# Patient Record
Sex: Female | Born: 1995 | Race: Black or African American | Hispanic: No | Marital: Single | Smoking: Never smoker
Health system: Southern US, Community
[De-identification: ages and names within clinical notes are randomized; demographics above are authoritative.]

## PROBLEM LIST (undated history)

## (undated) DIAGNOSIS — D249 Benign neoplasm of unspecified breast: Secondary | ICD-10-CM

## (undated) DIAGNOSIS — J353 Hypertrophy of tonsils with hypertrophy of adenoids: Secondary | ICD-10-CM

## (undated) DIAGNOSIS — G43909 Migraine, unspecified, not intractable, without status migrainosus: Secondary | ICD-10-CM

## (undated) HISTORY — PX: NO PAST SURGERIES: SHX2092

## (undated) HISTORY — DX: Benign neoplasm of unspecified breast: D24.9

---
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
## 2016-02-16 ENCOUNTER — Ambulatory Visit (INDEPENDENT_AMBULATORY_CARE_PROVIDER_SITE_OTHER): Payer: Managed Care, Other (non HMO) | Admitting: Otolaryngology

## 2016-02-16 DIAGNOSIS — L723 Sebaceous cyst: Secondary | ICD-10-CM | POA: Diagnosis not present

## 2016-04-20 ENCOUNTER — Other Ambulatory Visit: Payer: Self-pay | Admitting: Obstetrics & Gynecology

## 2016-11-10 ENCOUNTER — Other Ambulatory Visit: Payer: Self-pay | Admitting: Obstetrics & Gynecology

## 2017-02-08 ENCOUNTER — Encounter (HOSPITAL_COMMUNITY): Payer: Self-pay

## 2017-02-08 ENCOUNTER — Emergency Department (HOSPITAL_COMMUNITY)
Admission: EM | Admit: 2017-02-08 | Discharge: 2017-02-08 | Disposition: A | Discharge status: Home / Self Care | Payer: Managed Care, Other (non HMO) | Attending: Emergency Medicine | Admitting: Emergency Medicine

## 2017-02-08 ENCOUNTER — Emergency Department (HOSPITAL_COMMUNITY): Payer: Managed Care, Other (non HMO)

## 2017-02-08 DIAGNOSIS — N730 Acute parametritis and pelvic cellulitis: Secondary | ICD-10-CM | POA: Diagnosis not present

## 2017-02-08 DIAGNOSIS — N3001 Acute cystitis with hematuria: Secondary | ICD-10-CM

## 2017-02-08 DIAGNOSIS — R1032 Left lower quadrant pain: Secondary | ICD-10-CM | POA: Diagnosis present

## 2017-02-08 DIAGNOSIS — R109 Unspecified abdominal pain: Secondary | ICD-10-CM

## 2017-02-08 LAB — URINALYSIS, ROUTINE W REFLEX MICROSCOPIC
Bilirubin Urine: NEGATIVE
Glucose, UA: NEGATIVE mg/dL
HGB URINE DIPSTICK: NEGATIVE
Ketones, ur: NEGATIVE mg/dL
NITRITE: NEGATIVE
Protein, ur: NEGATIVE mg/dL
SPECIFIC GRAVITY, URINE: 1.017 (ref 1.005–1.030)
pH: 7 (ref 5.0–8.0)

## 2017-02-08 LAB — CBC WITH DIFFERENTIAL/PLATELET
BASOS ABS: 0 10*3/uL (ref 0.0–0.1)
Basophils Relative: 0 %
Eosinophils Absolute: 0.1 10*3/uL (ref 0.0–0.7)
Eosinophils Relative: 0 %
HEMATOCRIT: 32.7 % — AB (ref 36.0–46.0)
HEMOGLOBIN: 11 g/dL — AB (ref 12.0–15.0)
LYMPHS PCT: 18 %
Lymphs Abs: 2.9 10*3/uL (ref 0.7–4.0)
MCH: 27.2 pg (ref 26.0–34.0)
MCHC: 33.6 g/dL (ref 30.0–36.0)
MCV: 80.9 fL (ref 78.0–100.0)
MONO ABS: 1.2 10*3/uL — AB (ref 0.1–1.0)
Monocytes Relative: 7 %
NEUTROS ABS: 12.2 10*3/uL — AB (ref 1.7–7.7)
NEUTROS PCT: 75 %
Platelets: 367 10*3/uL (ref 150–400)
RBC: 4.04 MIL/uL (ref 3.87–5.11)
RDW: 12.7 % (ref 11.5–15.5)
WBC: 16.4 10*3/uL — AB (ref 4.0–10.5)

## 2017-02-08 LAB — BASIC METABOLIC PANEL
ANION GAP: 10 (ref 5–15)
BUN: 8 mg/dL (ref 6–20)
CHLORIDE: 100 mmol/L — AB (ref 101–111)
CO2: 23 mmol/L (ref 22–32)
Calcium: 9.1 mg/dL (ref 8.9–10.3)
Creatinine, Ser: 0.88 mg/dL (ref 0.44–1.00)
GFR calc non Af Amer: >60 mL/min (ref >60)
Glucose, Bld: 99 mg/dL (ref 65–99)
Potassium: 3.6 mmol/L (ref 3.5–5.1)
Sodium: 133 mmol/L — ABNORMAL LOW (ref 135–145)

## 2017-02-08 LAB — WET PREP, GENITAL
CLUE CELLS WET PREP: NONE SEEN
SPERM: NONE SEEN
TRICH WET PREP: NONE SEEN
YEAST WET PREP: NONE SEEN

## 2017-02-08 LAB — GC/CHLAMYDIA PROBE AMP (~~LOC~~) NOT AT ARMC
CHLAMYDIA, DNA PROBE: NEGATIVE
Neisseria Gonorrhea: NEGATIVE

## 2017-02-08 LAB — POC URINE PREG, ED: PREG TEST UR: NEGATIVE

## 2017-02-08 MED ORDER — ONDANSETRON HCL 4 MG/2ML IJ SOLN
4.0000 mg | Freq: Once | INTRAMUSCULAR | Status: AC
  Administered 2017-02-08: 4 mg via INTRAVENOUS
  Filled 2017-02-08: qty 2

## 2017-02-08 MED ORDER — ONDANSETRON 4 MG PO TBDP: 4.0000 mg | ORAL_TABLET | Freq: Three times a day (TID) | ORAL | 0 refills | Status: DC | PRN

## 2017-02-08 MED ORDER — DOXYCYCLINE HYCLATE 100 MG PO CAPS
100.0000 mg | ORAL_CAPSULE | Freq: Two times a day (BID) | ORAL | 0 refills | Status: DC
Start: 2017-02-08 — End: 2017-04-09

## 2017-02-08 MED ORDER — SODIUM CHLORIDE 0.9 % IV BOLUS (SEPSIS)
1000.0000 mL | Freq: Once | INTRAVENOUS | Status: AC
  Administered 2017-02-08: 1000 mL via INTRAVENOUS

## 2017-02-08 MED ORDER — ACETAMINOPHEN 325 MG PO TABS
ORAL_TABLET | ORAL | Status: AC
  Filled 2017-02-08: qty 2

## 2017-02-08 MED ORDER — DOXYCYCLINE HYCLATE 100 MG PO TABS
100.0000 mg | ORAL_TABLET | Freq: Once | ORAL | Status: AC
  Administered 2017-02-08: 100 mg via ORAL
  Filled 2017-02-08: qty 1

## 2017-02-08 MED ORDER — CEFTRIAXONE SODIUM 250 MG IJ SOLR: 250.0000 mg | Freq: Once | INTRAMUSCULAR | Status: DC

## 2017-02-08 MED ORDER — ACETAMINOPHEN 325 MG PO TABS
650.0000 mg | ORAL_TABLET | Freq: Once | ORAL | Status: AC
  Administered 2017-02-08: 650 mg via ORAL

## 2017-02-08 MED ORDER — DEXTROSE 5 % IV SOLN
1.0000 g | Freq: Once | INTRAVENOUS | Status: AC
  Administered 2017-02-08: 1 g via INTRAVENOUS
  Filled 2017-02-08: qty 10

## 2017-02-08 MED ORDER — CEPHALEXIN 500 MG PO CAPS: 500.0000 mg | ORAL_CAPSULE | Freq: Four times a day (QID) | ORAL | 0 refills | Status: DC

## 2017-02-08 NOTE — ED Provider Notes (Signed)
[place] DEPT Provider Note   CSN: [id number] Arrival date & time: 02/08/17  0126     History   Chief Complaint Chief Complaint  Patient presents with  . Hematuria    HPI Sherri Chang is a 21 y.o. female.  Patient is concerned that she has a kidney stone. She developed urinary urgency frequency and dysuria last night. Throughout the day today she developed left-sided flank pain and lower abdominal pain. Also had a fever at home to 101. She is febrile on arrival to the ED. Endorses dark-colored urine that she thinks is bloody. The pain is intermittent in her back and comes and goes but constant in her lower abdomen associated with burning with urination. Denies any abnormal vaginal discharge has irregular vaginal bleeding at baseline which is unchanged. Denies any chest pain or shortness of breath. Denies any vomiting or diarrhea. Denies any previous history of kidney stones or previous abdominal surgeries. She has not taken any antipyretics.   The history is provided by the patient.  Hematuria  Associated symptoms include abdominal pain. Pertinent negatives include no chest pain, no headaches and no shortness of breath.    History reviewed. No pertinent past medical history.  Patient Active Problem List   Diagnosis Date Noted  . Menorrhagia 06/30/2014  . Dysmenorrhea 06/30/2014    History reviewed. No pertinent surgical history.  OB History    No data available       Home Medications    Prior to Admission medications   Medication Sig Start Date End Date Taking? Authorizing Provider  APRI 0.15-30 MG-MCG tablet TAKE 1 TABLET BY MOUTH DAILY. 04/20/16  Yes Florian Buff, MD  acetaminophen (TYLENOL) 500 MG tablet Take 500 mg by mouth every 6 (six) hours as needed for moderate pain or headache.    Historical Provider, MD  amoxicillin (AMOXIL) 500 MG capsule Take 1 capsule (500 mg total) by mouth 3 (three) times daily. 08/17/15   Lily Kocher, PA-C  ibuprofen  (ADVIL,MOTRIN) 400 MG tablet Take 1 tablet (400 mg total) by mouth every 6 (six) hours as needed. 08/17/15   Lily Kocher, PA-C  naproxen sodium (ANAPROX) 550 MG tablet Take 550 mg by mouth 2 (two) times daily as needed for moderate pain.    Historical Provider, MD  naproxen sodium (ANAPROX) 550 MG tablet TAKE 1 TABLET (550 MG TOTAL) BY MOUTH 2 (TWO) TIMES DAILY WITH A MEAL. 11/11/16   Florian Buff, MD    Family History No family history on file.  Social History Social History  Substance Use Topics  . Smoking status: Never Smoker  . Smokeless tobacco: Never Used  . Alcohol use No     Allergies   Latex   Review of Systems Review of Systems  Constitutional: Positive for fever. Negative for activity change and appetite change.  HENT: Negative for congestion.   Respiratory: Negative for cough, chest tightness and shortness of breath.   Cardiovascular: Negative for chest pain.  Gastrointestinal: Positive for abdominal pain and nausea. Negative for vomiting.  Genitourinary: Positive for difficulty urinating, dysuria, flank pain, frequency and hematuria. Negative for vaginal bleeding and vaginal discharge.  Musculoskeletal: Positive for back pain. Negative for arthralgias and myalgias.  Neurological: Negative for dizziness, weakness and headaches.   A complete 10 system review of systems was obtained and all systems are negative except as noted in the HPI and PMH.    Physical Exam Updated Vital Signs BP 128/76 (BP Location: Left Arm)   Pulse Marland Kitchen)  109   Temp (!) 102.7 F (39.3 C) (Oral)   Resp 16   Ht 5' (1.524 m)   Wt 100 lb (45.4 kg)   LMP 01/29/2017 (Exact Date)   SpO2 100%   BMI 19.53 kg/m   Physical Exam  Constitutional: She is oriented to person, place, and time. She appears well-developed and well-nourished. No distress.  HENT:  Head: Normocephalic and atraumatic.  Mouth/Throat: Oropharynx is clear and moist. No oropharyngeal exudate.  Eyes: Conjunctivae and EOM  are normal. Pupils are equal, round, and reactive to light.  Neck: Normal range of motion. Neck supple.  No meningismus.  Cardiovascular: Normal rate, regular rhythm, normal heart sounds and intact distal pulses.   No murmur heard. Pulmonary/Chest: Effort normal and breath sounds normal. No respiratory distress.  Abdominal: Soft. There is tenderness. There is no rebound and no guarding.  Lower abdominal tenderness. No guarding or rebound  Genitourinary:  Genitourinary Comments: Chaperone present. Normal external genitalia. No vaginal discharge. Friable cervix with slight tenderness. No uterine tenderness. No lateralizing adnexal tenderness.  Musculoskeletal: Normal range of motion. She exhibits tenderness. She exhibits no edema.  L paraspinal lumbar tenderness  Neurological: She is alert and oriented to person, place, and time. No cranial nerve deficit. She exhibits normal muscle tone. Coordination normal.  No ataxia on finger to nose bilaterally. No pronator drift. 5/5 strength throughout. CN 2-12 intact.Equal grip strength. Sensation intact.   Skin: Skin is warm.  Psychiatric: She has a normal mood and affect. Her behavior is normal.  Nursing note and vitals reviewed.    ED Treatments / Results  Labs (all labs ordered are listed, but only abnormal results are displayed) Labs Reviewed  URINALYSIS, ROUTINE W REFLEX MICROSCOPIC - Abnormal; Notable for the following:       Result Value   APPearance HAZY (*)    Leukocytes, UA MODERATE (*)    Bacteria, UA RARE (*)    Squamous Epithelial / LPF 0-5 (*)    All other components within normal limits  URINE CULTURE  CBC WITH DIFFERENTIAL/PLATELET  BASIC METABOLIC PANEL  POC URINE PREG, ED    EKG  EKG Interpretation None       Radiology Ct Renal Stone Study  Result Date: 02/08/2017 CLINICAL DATA:  Left flank pain.  Fever and hematuria. EXAM: CT ABDOMEN AND PELVIS WITHOUT CONTRAST TECHNIQUE: Multidetector CT imaging of the abdomen  and pelvis was performed following the standard protocol without IV contrast. COMPARISON:  None. FINDINGS: Lower chest: The lung bases are clear. No pleural fluid or consolidation. Hepatobiliary: No focal liver abnormality is seen. No gallstones, gallbladder wall thickening, or biliary dilatation. Pancreas: Suboptimally assessed due to lack of contrast and paucity of intra-abdominal fat. No ductal dilatation or inflammation. Spleen: Normal in size without focal abnormality. Adrenals/Urinary Tract: No hydronephrosis or perinephric edema. No urolithiasis. Urinary bladder is nondistended and not well evaluated. Question of bladder wall thickening. No adrenal nodule. Stomach/Bowel: Limited assessment secondary to lack of enteric contrast and paucity of body fat. Particularly, bowel loops in the pelvis are suboptimally evaluated. No evidence of obstruction. Pelvic bowel loops are fluid-filled but nondilated. Appendix is not visualized. Vascular/Lymphatic: Limited assessment secondary to lack of contrast and paucity of body fat. No gross adenopathy. Reproductive: Diffuse induration of pelvic fat with fat stranding. Unenhanced uterus is unremarkable. The adnexa are suboptimally defined given adjacent pelvic bowel loops. Other: No free air.  No ascites. Musculoskeletal: There are no acute or suspicious osseous abnormalities. IMPRESSION: 1. No renal  stones or obstructive uropathy. Possible bladder wall thickening which suggests cystitis. 2. Induration of pelvic fat diffusely, nonspecific but can be seen with pelvic inflammatory disease. The adnexa including ovaries are not well-defined. Electronically Signed   By: Jeb Levering M.D.   On: 02/08/2017 03:19    Procedures Procedures (including critical care time)  Medications Ordered in ED Medications  sodium chloride 0.9 % bolus 1,000 mL (not administered)  ondansetron (ZOFRAN) injection 4 mg (not administered)  acetaminophen (TYLENOL) tablet 650 mg (650 mg Oral  Given 02/08/17 0143)     Initial Impression / Assessment and Plan / ED Course  I have reviewed the triage vital signs and the nursing notes.  Pertinent labs & imaging results that were available during my care of the patient were reviewed by me and considered in my medical decision making (see chart for details).    Urinary symptoms with lower abdominal pain, flank pain and fever. Patient tachycardic and febrile but nontoxic appearing.  HCG is negative. Urinalysis shows leukocyte esterase and white blood cells. Culture sent.  CT scan shows no ureteral stones. Does show inflammation of uterus and adnexa. Pelvic exam performed as above.  Will treat for possible PID as well given mild CMT.  Patient already received 1 g Rocephin IV for UTI.  D/w Tiffany RPH who states this is adequate PID treatment as well and IM rocephin not necessary.  Will treat with doxycycline to complete PID treatment and keflex for UTI.  Patient well-appearing, tolerating by mouth, heart rate and fever improved. We'll treat for PID and UTI. Urine culture pending. Return precautions discussed.  BP 120/76 (BP Location: Left Arm)   Pulse 95   Temp 99.1 F (37.3 C) (Oral)   Resp 18   Ht 5' (1.524 m)   Wt 100 lb (45.4 kg)   LMP 01/29/2017 (Exact Date) Comment: UPREG NEGATIVE  SpO2 100%   BMI 19.53 kg/m    Final Clinical Impressions(s) / ED Diagnoses   Final diagnoses:  Flank pain  Acute cystitis with hematuria  PID (acute pelvic inflammatory disease)    New Prescriptions New Prescriptions   No medications on file     Ezequiel Essex, MD 02/08/17 0522

## 2017-02-08 NOTE — ED Triage Notes (Signed)
I think I have a kidney stone.  I have a fever, blood in my urine, and I am constantly going to the bathroom.  Started going to the bathroom a lot last night.  Started running a fever today and having blood in my urine today.  I am also having pain in my back on the left side and in my abdomen on the left side.  Burning with urination also.

## 2017-02-08 NOTE — Discharge Instructions (Signed)
Take the antibiotics as prescribed. Establish care with a primary care physician. Return to the ED if you develop new or worsening symptoms.

## 2017-02-09 LAB — URINE CULTURE: Culture: NO GROWTH

## 2017-02-28 ENCOUNTER — Ambulatory Visit (INDEPENDENT_AMBULATORY_CARE_PROVIDER_SITE_OTHER): Payer: Managed Care, Other (non HMO) | Admitting: Otolaryngology

## 2017-02-28 ENCOUNTER — Other Ambulatory Visit: Payer: Self-pay | Admitting: Obstetrics & Gynecology

## 2017-02-28 DIAGNOSIS — J351 Hypertrophy of tonsils: Secondary | ICD-10-CM | POA: Diagnosis not present

## 2017-02-28 DIAGNOSIS — J3501 Chronic tonsillitis: Secondary | ICD-10-CM | POA: Diagnosis not present

## 2017-03-12 DIAGNOSIS — J353 Hypertrophy of tonsils with hypertrophy of adenoids: Secondary | ICD-10-CM

## 2017-03-12 HISTORY — DX: Hypertrophy of tonsils with hypertrophy of adenoids: J35.3

## 2017-03-19 ENCOUNTER — Other Ambulatory Visit: Payer: Self-pay | Admitting: Otolaryngology

## 2017-04-09 ENCOUNTER — Encounter (HOSPITAL_BASED_OUTPATIENT_CLINIC_OR_DEPARTMENT_OTHER): Payer: Self-pay | Admitting: *Deleted

## 2017-04-15 ENCOUNTER — Ambulatory Visit (HOSPITAL_BASED_OUTPATIENT_CLINIC_OR_DEPARTMENT_OTHER)
Admission: RE | Admit: 2017-04-15 | Payer: Managed Care, Other (non HMO) | Source: Ambulatory Visit | Admitting: Otolaryngology

## 2017-04-15 HISTORY — DX: Hypertrophy of tonsils with hypertrophy of adenoids: J35.3

## 2017-04-15 HISTORY — DX: Migraine, unspecified, not intractable, without status migrainosus: G43.909

## 2017-04-15 SURGERY — TONSILLECTOMY AND ADENOIDECTOMY
Anesthesia: General

## 2017-11-01 ENCOUNTER — Ambulatory Visit: Payer: 59 | Admitting: Adult Health

## 2017-11-01 ENCOUNTER — Encounter: Payer: Self-pay | Admitting: Adult Health

## 2017-11-01 VITALS — BP 112/80 | HR 74 | Ht 60.0 in | Wt 97.5 lb

## 2017-11-01 DIAGNOSIS — N6313 Unspecified lump in the right breast, lower outer quadrant: Secondary | ICD-10-CM

## 2017-11-01 NOTE — Progress Notes (Signed)
Subjective:     Patient ID: Sherri Chang, female   DOB: 09-Oct-1996, 21 y.o.   MRN: [id number]  HPI Sherri Chang is a 21 year old black female in complaining or right breast lump, that is tender and she first noticed Sunday.  Review of Systems Right breast lump, tender  Reviewed past medical,surgical, social and family history. Reviewed medications and allergies.     Objective:   Physical Exam BP 112/80 (BP Location: Left Arm, Patient Position: Sitting, Cuff Size: Normal)   Pulse 74   Ht 5' (1.524 m)   Wt 97 lb 8 oz (44.2 kg)   BMI 19.04 kg/m     Skin warm and dry,  Breasts:no dominate palpable mass, retraction or nipple discharge on left, on right, no retraction or nipple discharge, has round, mobile, tender marble sized mass at 7 o'clock, 2 FB from areola. Will get [place] to assess.  Assessment:     1. Mass of lower outer quadrant of right breast       Plan:     Right breast US at [facility] 11/13/17 at 11:40 am F/U prn Review handouts on breat cyst and fibroadenoma

## 2017-11-01 NOTE — Patient Instructions (Signed)
Breast Cyst A breast cyst is a sac in the breast that is filled with fluid. Breast cysts are usually noncancerous (benign). They are common among women, and they are most often located in the upper, outer portion of the breast. One or more cysts may develop. They form when fluid builds up inside of the breast glands. There are several types of breast cysts:  Macrocyst. This is a cyst that is about 2 inches (5.1 cm) across (in diameter).  Microcyst. This is a very small cyst that you cannot feel, but it can be seen with imaging tests such as an X-ray of the breast (mammogram) or ultrasound.  Galactocele. This is a cyst that contains milk. It may develop if you suddenly stop breastfeeding.  Breast cysts do not increase your risk of breast cancer. They usually disappear after menopause, unless you take artificial hormones (are on hormone therapy). What are the causes? The exact cause of breast cysts is not known. Possible causes include:  Blockage of tubes (ducts) in the breast glands, which leads to fluid buildup. Duct blockage may result from: ? Fibrocystic breast changes. This is a common, benign condition that occurs when women go through hormonal changes during the menstrual cycle. This is a common cause of multiple breast cysts. ? Overgrowth of breast tissue or breast glands. ? Scar tissue in the breast from previous surgery.  Changes in certain female hormones (estrogen and progesterone).  What increases the risk? You may be more likely to develop breast cysts if you have not gone through menopause. What are the signs or symptoms? Symptoms of a breast cyst may include:  Feeling one or more smooth, round, soft lumps (like grapes) in the breast that are easily moveable. The lump(s) may get bigger and more painful before your period and get smaller after your period.  Breast discomfort or pain.  How is this diagnosed? A cyst can be felt during a physical exam by your health care  provider. A mammogram and ultrasound will be done to confirm the diagnosis. Fluid may be removed from the cyst with a needle (fine-needle aspiration) and tested to make sure the cyst is not cancerous. How is this treated? Treatment may not be necessary. Your health care provider may monitor the cyst to see if it goes away on its own. If the cyst is uncomfortable or gets bigger, or if you do not like how the cyst makes your breast look, you may need treatment. Treatment may include:  Hormone treatment.  Fine-needle aspiration, to drain fluid from the cyst. There is a chance of the cyst coming back (recurring) after aspiration.  Surgery to remove the cyst.  Follow these instructions at home:  See your health care provider regularly. ? Get a yearly physical exam. ? If you are 20-40 years old, get a clinical breast exam every 1-3 years. After age 40, get this exam every year. ? Get mammograms as often as directed.  Do a breast self-exam every month, or as often as directed. Having many breast cysts, or "lumpy" breasts, may make it harder to feel for new lumps. Understand how your breasts normally look and feel, and write down any changes in your breasts so you can tell your health care provider about the changes. A breast self-exam involves: ? Comparing your breasts in the mirror. ? Looking for visible changes in your skin or nipples. ? Feeling for lumps or changes.  Take over-the-counter and prescription medicines only as told by your health care   provider.  Wear a supportive bra, especially when exercising.  Follow instructions from your health care provider about eating and drinking restrictions. ? Avoid caffeine. ? Cut down on salt (sodium) in what you eat and drink, especially before your menstrual period. Too much sodium can cause fluid buildup (retention), breast swelling, and discomfort.  Keep all follow-up visits as told your health care provider. This is important. Contact a  health care provider if:  You feel, or think you feel, a lump in your breast.  You notice that both breasts look or feel different than usual.  Your breast is still causing pain after your menstrual period is over.  You find new lumps or bumps that were not there before.  You feel lumps in your armpit (axilla). Get help right away if:  You have severe pain, tenderness, redness, or warmth in your breast.  You have fluid or blood leaking from your nipple.  Your breast lump becomes hard and painful.  You notice dimpling or wrinkling of the breast or nipple. This information is not intended to replace advice given to you by your health care provider. Make sure you discuss any questions you have with your health care provider. Document Released: 10/29/2005 Document Revised: 07/20/2016 Document Reviewed: 07/20/2016 Elsevier Interactive Patient Education  2017 [place] A fibroadenoma is a lump (tumor) in the breast. The lump is not cancer (is benign). It may move under your skin when you touch it. This kind of lump can grow in one breast or in both breasts. Follow these instructions at home:  If you had a lump removed, follow instructions from your doctor for home care after the procedure.  Check your breasts at home as told by your doctor.  Keep all follow-up visits as told by your doctor. This is important. Contact a doctor if:  The lump changes in size or feels different.  The lump starts to be painful.  You find a new lump.  You have any changes in the skin that covers your breast.  You have any changes in your nipple.  You have fluid leaking from your nipple. This information is not intended to replace advice given to you by your health care provider. Make sure you discuss any questions you have with your health care provider. Document Released: 01/25/2009 Document Revised: 04/05/2016 Document Reviewed: 10/20/2014 Elsevier Interactive Patient Education   [employer].

## 2017-11-13 ENCOUNTER — Other Ambulatory Visit (HOSPITAL_COMMUNITY): Payer: Managed Care, Other (non HMO)

## 2017-11-13 ENCOUNTER — Ambulatory Visit (HOSPITAL_COMMUNITY): Admission: RE | Admit: 2017-11-13 | Payer: Managed Care, Other (non HMO) | Source: Ambulatory Visit

## 2017-11-19 ENCOUNTER — Ambulatory Visit (HOSPITAL_COMMUNITY): Payer: Managed Care, Other (non HMO)

## 2017-11-19 ENCOUNTER — Encounter: Payer: Self-pay | Admitting: Adult Health

## 2017-11-19 ENCOUNTER — Ambulatory Visit (HOSPITAL_COMMUNITY)
Admission: RE | Admit: 2017-11-19 | Discharge: 2017-11-19 | Disposition: A | Discharge status: Home / Self Care | Payer: Managed Care, Other (non HMO) | Source: Ambulatory Visit | Attending: Adult Health | Admitting: Adult Health

## 2017-11-19 DIAGNOSIS — N6313 Unspecified lump in the right breast, lower outer quadrant: Secondary | ICD-10-CM | POA: Insufficient documentation

## 2017-11-19 DIAGNOSIS — D249 Benign neoplasm of unspecified breast: Secondary | ICD-10-CM

## 2017-11-19 HISTORY — DX: Benign neoplasm of unspecified breast: D24.9

## 2017-12-15 ENCOUNTER — Other Ambulatory Visit: Payer: Self-pay | Admitting: Obstetrics & Gynecology

## 2018-05-22 ENCOUNTER — Emergency Department (HOSPITAL_COMMUNITY)
Admission: EM | Admit: 2018-05-22 | Discharge: 2018-05-22 | Disposition: A | Discharge status: Home / Self Care | Payer: Managed Care, Other (non HMO) | Attending: Emergency Medicine | Admitting: Emergency Medicine

## 2018-05-22 ENCOUNTER — Encounter (HOSPITAL_COMMUNITY): Payer: Self-pay | Admitting: Emergency Medicine

## 2018-05-22 ENCOUNTER — Other Ambulatory Visit: Payer: Self-pay

## 2018-05-22 DIAGNOSIS — R102 Pelvic and perineal pain: Secondary | ICD-10-CM | POA: Diagnosis present

## 2018-05-22 DIAGNOSIS — Z79899 Other long term (current) drug therapy: Secondary | ICD-10-CM | POA: Diagnosis not present

## 2018-05-22 DIAGNOSIS — B9689 Other specified bacterial agents as the cause of diseases classified elsewhere: Secondary | ICD-10-CM

## 2018-05-22 DIAGNOSIS — N760 Acute vaginitis: Secondary | ICD-10-CM | POA: Diagnosis not present

## 2018-05-22 LAB — CBC WITH DIFFERENTIAL/PLATELET
Basophils Absolute: 0 10*3/uL (ref 0.0–0.1)
Basophils Relative: 0 %
Eosinophils Absolute: 0.1 10*3/uL (ref 0.0–0.7)
Eosinophils Relative: 1 %
HCT: 36.2 % (ref 36.0–46.0)
Hemoglobin: 11.9 g/dL — ABNORMAL LOW (ref 12.0–15.0)
Lymphocytes Relative: 54 %
Lymphs Abs: 2.8 10*3/uL (ref 0.7–4.0)
MCH: 27.8 pg (ref 26.0–34.0)
MCHC: 32.9 g/dL (ref 30.0–36.0)
MCV: 84.6 fL (ref 78.0–100.0)
Monocytes Absolute: 0.4 10*3/uL (ref 0.1–1.0)
Monocytes Relative: 8 %
Neutro Abs: 2 10*3/uL (ref 1.7–7.7)
Neutrophils Relative %: 37 %
Platelets: 334 10*3/uL (ref 150–400)
RBC: 4.28 MIL/uL (ref 3.87–5.11)
RDW: 13.1 % (ref 11.5–15.5)
WBC: 5.2 10*3/uL (ref 4.0–10.5)

## 2018-05-22 LAB — COMPREHENSIVE METABOLIC PANEL
ALT: 10 U/L (ref 0–44)
AST: 17 U/L (ref 15–41)
Albumin: 4.1 g/dL (ref 3.5–5.0)
Alkaline Phosphatase: 43 U/L (ref 38–126)
Anion gap: 8 (ref 5–15)
BUN: 7 mg/dL (ref 6–20)
CO2: 24 mmol/L (ref 22–32)
Calcium: 9 mg/dL (ref 8.9–10.3)
Chloride: 107 mmol/L (ref 98–111)
Creatinine, Ser: 0.64 mg/dL (ref 0.44–1.00)
GFR calc Af Amer: >60 mL/min (ref >60)
GFR calc non Af Amer: >60 mL/min (ref >60)
Glucose, Bld: 90 mg/dL (ref 70–99)
Potassium: 3.6 mmol/L (ref 3.5–5.1)
Sodium: 139 mmol/L (ref 135–145)
Total Bilirubin: 0.9 mg/dL (ref 0.3–1.2)
Total Protein: 7.8 g/dL (ref 6.5–8.1)

## 2018-05-22 LAB — URINALYSIS, ROUTINE W REFLEX MICROSCOPIC
Bilirubin Urine: NEGATIVE
Glucose, UA: NEGATIVE mg/dL
Hgb urine dipstick: NEGATIVE
Ketones, ur: NEGATIVE mg/dL
Leukocytes, UA: NEGATIVE
Nitrite: NEGATIVE
Protein, ur: NEGATIVE mg/dL
Specific Gravity, Urine: 1.021 (ref 1.005–1.030)
pH: 6 (ref 5.0–8.0)

## 2018-05-22 LAB — PREGNANCY, URINE: Preg Test, Ur: NEGATIVE

## 2018-05-22 LAB — WET PREP, GENITAL
Sperm: NONE SEEN
Trich, Wet Prep: NONE SEEN
Yeast Wet Prep HPF POC: NONE SEEN

## 2018-05-22 LAB — LIPASE, BLOOD: Lipase: 36 U/L (ref 11–51)

## 2018-05-22 MED ORDER — METRONIDAZOLE 500 MG PO TABS: 500.0000 mg | ORAL_TABLET | Freq: Two times a day (BID) | ORAL | 0 refills | Status: DC

## 2018-05-22 NOTE — ED Triage Notes (Signed)
PT c/o middle abdominal pain but denies any n/v/d or urinary symptoms x3 days. PT states normal BM yesterday and no vaginal discharge or discomfort.

## 2018-05-23 LAB — GC/CHLAMYDIA PROBE AMP (~~LOC~~) NOT AT ARMC
Chlamydia: NEGATIVE
Neisseria Gonorrhea: NEGATIVE

## 2018-05-26 NOTE — ED Provider Notes (Signed)
[facility] EMERGENCY DEPARTMENT Provider Note   CSN: [id number] Arrival date & time: 05/22/18  3976     History   Chief Complaint Chief Complaint  Patient presents with  . Abdominal Pain    HPI Brileigh Sevcik is a 22 y.o. female.  HPI   22 year old female with mid to lower abdominal pain.  Onset about 3 days ago..  And is fairly constant.  Describes it as a deep ache.  No urinary complaints.  No nausea, vomiting or diarrhea.  No unusual vaginal bleeding or discharge.  Past Medical History:  Diagnosis Date  . Adenotonsillar hypertrophy 03/2017  . 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 Migraines     Patient Active Problem List   Diagnosis Date Noted  . 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 Menorrhagia 06/30/2014  . Dysmenorrhea 06/30/2014    Past Surgical History:  Procedure Laterality Date  . NO PAST SURGERIES       OB History    Gravida  0   Para  0   Term  0   Preterm  0   AB  0   Living  0     SAB  0   TAB  0   Ectopic  0   Multiple  0   Live Births  0            Home Medications    Prior to Admission medications   Medication Sig Start Date End Date Taking? Authorizing Provider  APRI 0.15-30 MG-MCG tablet TAKE 1 TABLET BY MOUTH DAILY. 12/16/17  Yes Florian Buff, MD  metroNIDAZOLE (FLAGYL) 500 MG tablet Take 1 tablet (500 mg total) by mouth 2 (two) times daily. 05/22/18   Virgel Manifold, MD    Family History History reviewed. No pertinent family history.  Social History Social History   Tobacco Use  . Smoking status: Never Smoker  . Smokeless tobacco: Never Used  Substance Use Topics  . Alcohol use: No  . Drug use: No     Allergies   Latex   Review of Systems Review of Systems  All systems reviewed and negative, other than as noted in HPI.  Physical Exam Updated Vital Signs BP 110/74   Pulse 70   Temp 98 F (36.7 C)   Resp 18   Ht 5' (1.524 m)   Wt 45.4 kg (100 lb)   LMP 04/22/2018   SpO2 99%   BMI 19.53 kg/m   Physical  Exam  Constitutional: She appears well-developed and well-nourished. No distress.  HENT:  Head: Normocephalic and atraumatic.  Eyes: Conjunctivae are normal. Right eye exhibits no discharge. Left eye exhibits no discharge.  Neck: Neck supple.  Cardiovascular: Normal rate, regular rhythm and normal heart sounds. Exam reveals no gallop and no friction rub.  No murmur heard. Pulmonary/Chest: Effort normal and breath sounds normal. No respiratory distress.  Abdominal: Soft. She exhibits no distension. There is no tenderness.  Genitourinary:  Genitourinary Comments: Chaperone present.  No external lesions noted.  Moderate whitish/gray discharge in vaginal vault.  Normal-appearing cervix.  No CMT or adnexal tenderness.  Musculoskeletal: She exhibits no edema or tenderness.  Neurological: She is alert.  Skin: Skin is warm and dry.  Psychiatric: She has a normal mood and affect. Her behavior is normal. Thought content normal.  Nursing note and vitals reviewed.    ED Treatments / Results  Labs (all labs ordered are listed, but only abnormal results are displayed) Labs Reviewed  WET PREP, GENITAL - Abnormal; Notable for the  following components:      Result Value   Clue Cells Wet Prep HPF POC PRESENT (*)    WBC, Wet Prep HPF POC MANY (*)    All other components within normal limits  CBC WITH DIFFERENTIAL/PLATELET - Abnormal; Notable for the following components:   Hemoglobin 11.9 (*)    All other components within normal limits  URINALYSIS, ROUTINE W REFLEX MICROSCOPIC  PREGNANCY, URINE  COMPREHENSIVE METABOLIC PANEL  LIPASE, BLOOD  GC/CHLAMYDIA PROBE AMP ([place]) NOT AT [facility]    EKG None  Radiology No results found.  Procedures Procedures (including critical care time)  Medications Ordered in ED Medications - No data to display   Initial Impression / Assessment and Plan / ED Course  I have reviewed the triage vital signs and the nursing notes.  Pertinent labs &  imaging results that were available during my care of the patient were reviewed by me and considered in my medical decision making (see chart for details).     22 year old female with mild mid to lower abdominal pain.  Abdominal exam is benign.  Wet prep consistent with BV.  We will treat.  I doubt emergent process.  Final Clinical Impressions(s) / ED Diagnoses   Final diagnoses:  Bacterial vaginosis    ED Discharge Orders        Ordered    metroNIDAZOLE (FLAGYL) 500 MG tablet  2 times daily     05/22/18 1219       Virgel Manifold, MD 05/26/18 1501

## 2018-08-30 ENCOUNTER — Other Ambulatory Visit: Payer: Self-pay | Admitting: Obstetrics & Gynecology

## 2018-10-21 DIAGNOSIS — M549 Dorsalgia, unspecified: Secondary | ICD-10-CM | POA: Insufficient documentation

## 2018-10-21 DIAGNOSIS — Y9389 Activity, other specified: Secondary | ICD-10-CM | POA: Insufficient documentation

## 2018-10-21 DIAGNOSIS — Y92410 Unspecified street and highway as the place of occurrence of the external cause: Secondary | ICD-10-CM | POA: Insufficient documentation

## 2018-10-21 DIAGNOSIS — Y999 Unspecified external cause status: Secondary | ICD-10-CM | POA: Insufficient documentation

## 2018-10-21 DIAGNOSIS — S161XXA Strain of muscle, fascia and tendon at neck level, initial encounter: Secondary | ICD-10-CM | POA: Diagnosis not present

## 2018-10-21 DIAGNOSIS — S199XXA Unspecified injury of neck, initial encounter: Secondary | ICD-10-CM | POA: Diagnosis present

## 2018-10-22 ENCOUNTER — Encounter (HOSPITAL_COMMUNITY): Payer: Self-pay | Admitting: Emergency Medicine

## 2018-10-22 ENCOUNTER — Emergency Department (HOSPITAL_COMMUNITY): Payer: Managed Care, Other (non HMO)

## 2018-10-22 ENCOUNTER — Emergency Department (HOSPITAL_COMMUNITY)
Admission: EM | Admit: 2018-10-22 | Discharge: 2018-10-22 | Disposition: A | Discharge status: Home / Self Care | Payer: Managed Care, Other (non HMO) | Attending: Emergency Medicine | Admitting: Emergency Medicine

## 2018-10-22 ENCOUNTER — Other Ambulatory Visit: Payer: Self-pay

## 2018-10-22 DIAGNOSIS — S161XXA Strain of muscle, fascia and tendon at neck level, initial encounter: Secondary | ICD-10-CM

## 2018-10-22 MED ORDER — IBUPROFEN 600 MG PO TABS: 600.0000 mg | ORAL_TABLET | Freq: Four times a day (QID) | ORAL | 0 refills | Status: DC | PRN

## 2018-10-22 MED ORDER — IBUPROFEN 400 MG PO TABS
400.0000 mg | ORAL_TABLET | Freq: Once | ORAL | Status: AC
  Administered 2018-10-22: 400 mg via ORAL
  Filled 2018-10-22: qty 1

## 2018-10-22 NOTE — Discharge Instructions (Addendum)
Take the anti-inflammatories as prescribed and follow-up with your doctor.  Return to the ED with new or worsening symptoms.
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
## 2018-10-22 NOTE — ED Notes (Signed)
Patient transported to X-ray 

## 2019-01-16 ENCOUNTER — Emergency Department (HOSPITAL_COMMUNITY)
Admission: EM | Admit: 2019-01-16 | Discharge: 2019-01-16 | Disposition: A | Discharge status: Home / Self Care | Payer: 59 | Attending: Emergency Medicine | Admitting: Emergency Medicine

## 2019-01-16 ENCOUNTER — Other Ambulatory Visit: Payer: Self-pay

## 2019-01-16 ENCOUNTER — Encounter (HOSPITAL_COMMUNITY): Payer: Self-pay | Admitting: *Deleted

## 2019-01-16 ENCOUNTER — Emergency Department (HOSPITAL_COMMUNITY): Payer: 59

## 2019-01-16 DIAGNOSIS — J069 Acute upper respiratory infection, unspecified: Secondary | ICD-10-CM | POA: Diagnosis not present

## 2019-01-16 DIAGNOSIS — Z79899 Other long term (current) drug therapy: Secondary | ICD-10-CM | POA: Diagnosis not present

## 2019-01-16 DIAGNOSIS — Z91040 Latex allergy status: Secondary | ICD-10-CM | POA: Insufficient documentation

## 2019-01-16 DIAGNOSIS — R509 Fever, unspecified: Secondary | ICD-10-CM | POA: Diagnosis present

## 2019-01-16 MED ORDER — BENZONATATE 100 MG PO CAPS: 100.0000 mg | ORAL_CAPSULE | Freq: Three times a day (TID) | ORAL | 0 refills | Status: DC

## 2019-01-16 MED ORDER — ONDANSETRON HCL 4 MG PO TABS: 4.0000 mg | ORAL_TABLET | Freq: Three times a day (TID) | ORAL | 0 refills | Status: DC | PRN

## 2019-01-16 MED ORDER — ONDANSETRON 4 MG PO TBDP
4.0000 mg | ORAL_TABLET | Freq: Once | ORAL | Status: AC
  Administered 2019-01-16: 4 mg via ORAL
  Filled 2019-01-16: qty 1

## 2019-01-16 MED ORDER — FLUTICASONE PROPIONATE 50 MCG/ACT NA SUSP: 1.0000 | Freq: Every day | NASAL | 0 refills | Status: DC
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
## 2019-01-16 NOTE — Discharge Instructions (Addendum)
You likely have a viral illness.  This should be treated symptomatically. Use Tylenol or ibuprofen as needed for fevers or body aches. Use Flonase daily for nasal congestion and cough. Use zofran as needed for nausea or vomiting.  Use cough medicine as needed.  Make sure you stay well-hydrated with water. Wash your hands frequently to prevent spread of infection. Follow-up with your primary care doctor in 1 week if your symptoms are not improving. Return to the emergency room if you develop chest pain, difficulty breathing, or any new or worsening symptoms.
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
## 2019-05-30 ENCOUNTER — Other Ambulatory Visit: Payer: Self-pay | Admitting: Obstetrics & Gynecology

## 2019-11-17 ENCOUNTER — Ambulatory Visit: Payer: 59 | Admitting: Adult Health

## 2019-12-29 ENCOUNTER — Other Ambulatory Visit: Payer: Self-pay | Admitting: Obstetrics & Gynecology

## 2020-03-29 ENCOUNTER — Encounter: Payer: Self-pay | Admitting: Emergency Medicine

## 2020-03-29 ENCOUNTER — Other Ambulatory Visit: Payer: Self-pay

## 2020-03-29 DIAGNOSIS — R55 Syncope and collapse: Secondary | ICD-10-CM | POA: Insufficient documentation

## 2020-03-29 DIAGNOSIS — R42 Dizziness and giddiness: Secondary | ICD-10-CM | POA: Diagnosis not present

## 2020-03-29 DIAGNOSIS — Z5321 Procedure and treatment not carried out due to patient leaving prior to being seen by health care provider: Secondary | ICD-10-CM | POA: Insufficient documentation

## 2020-03-29 LAB — URINALYSIS, COMPLETE (UACMP) WITH MICROSCOPIC
Bacteria, UA: NONE SEEN
Bilirubin Urine: NEGATIVE
Glucose, UA: NEGATIVE mg/dL
Hgb urine dipstick: NEGATIVE
Ketones, ur: NEGATIVE mg/dL
Leukocytes,Ua: NEGATIVE
Nitrite: NEGATIVE
Protein, ur: 30 mg/dL — AB
Specific Gravity, Urine: 1.03 (ref 1.005–1.030)
pH: 5 (ref 5.0–8.0)

## 2020-03-29 LAB — COMPREHENSIVE METABOLIC PANEL
ALT: 10 U/L (ref 0–44)
AST: 15 U/L (ref 15–41)
Albumin: 4.2 g/dL (ref 3.5–5.0)
Alkaline Phosphatase: 40 U/L (ref 38–126)
Anion gap: 9 (ref 5–15)
BUN: 11 mg/dL (ref 6–20)
CO2: 23 mmol/L (ref 22–32)
Calcium: 9.1 mg/dL (ref 8.9–10.3)
Chloride: 104 mmol/L (ref 98–111)
Creatinine, Ser: 0.71 mg/dL (ref 0.44–1.00)
GFR calc Af Amer: >60 mL/min (ref >60)
GFR calc non Af Amer: >60 mL/min (ref >60)
Glucose, Bld: 94 mg/dL (ref 70–99)
Potassium: 3.4 mmol/L — ABNORMAL LOW (ref 3.5–5.1)
Sodium: 136 mmol/L (ref 135–145)
Total Bilirubin: 0.8 mg/dL (ref 0.3–1.2)
Total Protein: 7.6 g/dL (ref 6.5–8.1)

## 2020-03-29 LAB — CBC WITH DIFFERENTIAL/PLATELET
Abs Immature Granulocytes: 0.01 10*3/uL (ref 0.00–0.07)
Basophils Absolute: 0 10*3/uL (ref 0.0–0.1)
Basophils Relative: 1 %
Eosinophils Absolute: 0.1 10*3/uL (ref 0.0–0.5)
Eosinophils Relative: 1 %
HCT: 35 % — ABNORMAL LOW (ref 36.0–46.0)
Hemoglobin: 11.4 g/dL — ABNORMAL LOW (ref 12.0–15.0)
Immature Granulocytes: 0 %
Lymphocytes Relative: 44 %
Lymphs Abs: 2.9 10*3/uL (ref 0.7–4.0)
MCH: 27.6 pg (ref 26.0–34.0)
MCHC: 32.6 g/dL (ref 30.0–36.0)
MCV: 84.7 fL (ref 80.0–100.0)
Monocytes Absolute: 0.5 10*3/uL (ref 0.1–1.0)
Monocytes Relative: 7 %
Neutro Abs: 3.1 10*3/uL (ref 1.7–7.7)
Neutrophils Relative %: 47 %
Platelets: 300 10*3/uL (ref 150–400)
RBC: 4.13 MIL/uL (ref 3.87–5.11)
RDW: 12.7 % (ref 11.5–15.5)
WBC: 6.5 10*3/uL (ref 4.0–10.5)
nRBC: 0 % (ref 0.0–0.2)

## 2020-03-29 LAB — TROPONIN I (HIGH SENSITIVITY): Troponin I (High Sensitivity): <2 ng/L (ref <18)

## 2020-03-29 LAB — POCT PREGNANCY, URINE: Preg Test, Ur: NEGATIVE

## 2020-03-29 NOTE — ED Triage Notes (Signed)
Patient ambulatory to triage with steady gait, without difficulty or distress noted, pt reports dizziness with syncopal episode at restaurant PTA: denies any recent illness, denies hx of same, denies symptoms at present

## 2020-03-30 ENCOUNTER — Emergency Department
Admission: EM | Admit: 2020-03-30 | Discharge: 2020-03-30 | Disposition: A | Discharge status: Home / Self Care | Payer: 59 | Attending: Emergency Medicine | Admitting: Emergency Medicine

## 2020-03-30 NOTE — ED Notes (Signed)
Pt called from lobby with no answer. Unable to locate pt.

## 2020-09-06 ENCOUNTER — Other Ambulatory Visit: Payer: Self-pay | Admitting: Obstetrics & Gynecology

## 2021-01-23 IMAGING — DX DG CHEST 2V
2 series · 2 of 2 positions shown · non-contrast
Comparison: None.

CLINICAL DATA: Cough and fever

EXAM:
CHEST - 2 VIEW

[chest pa]
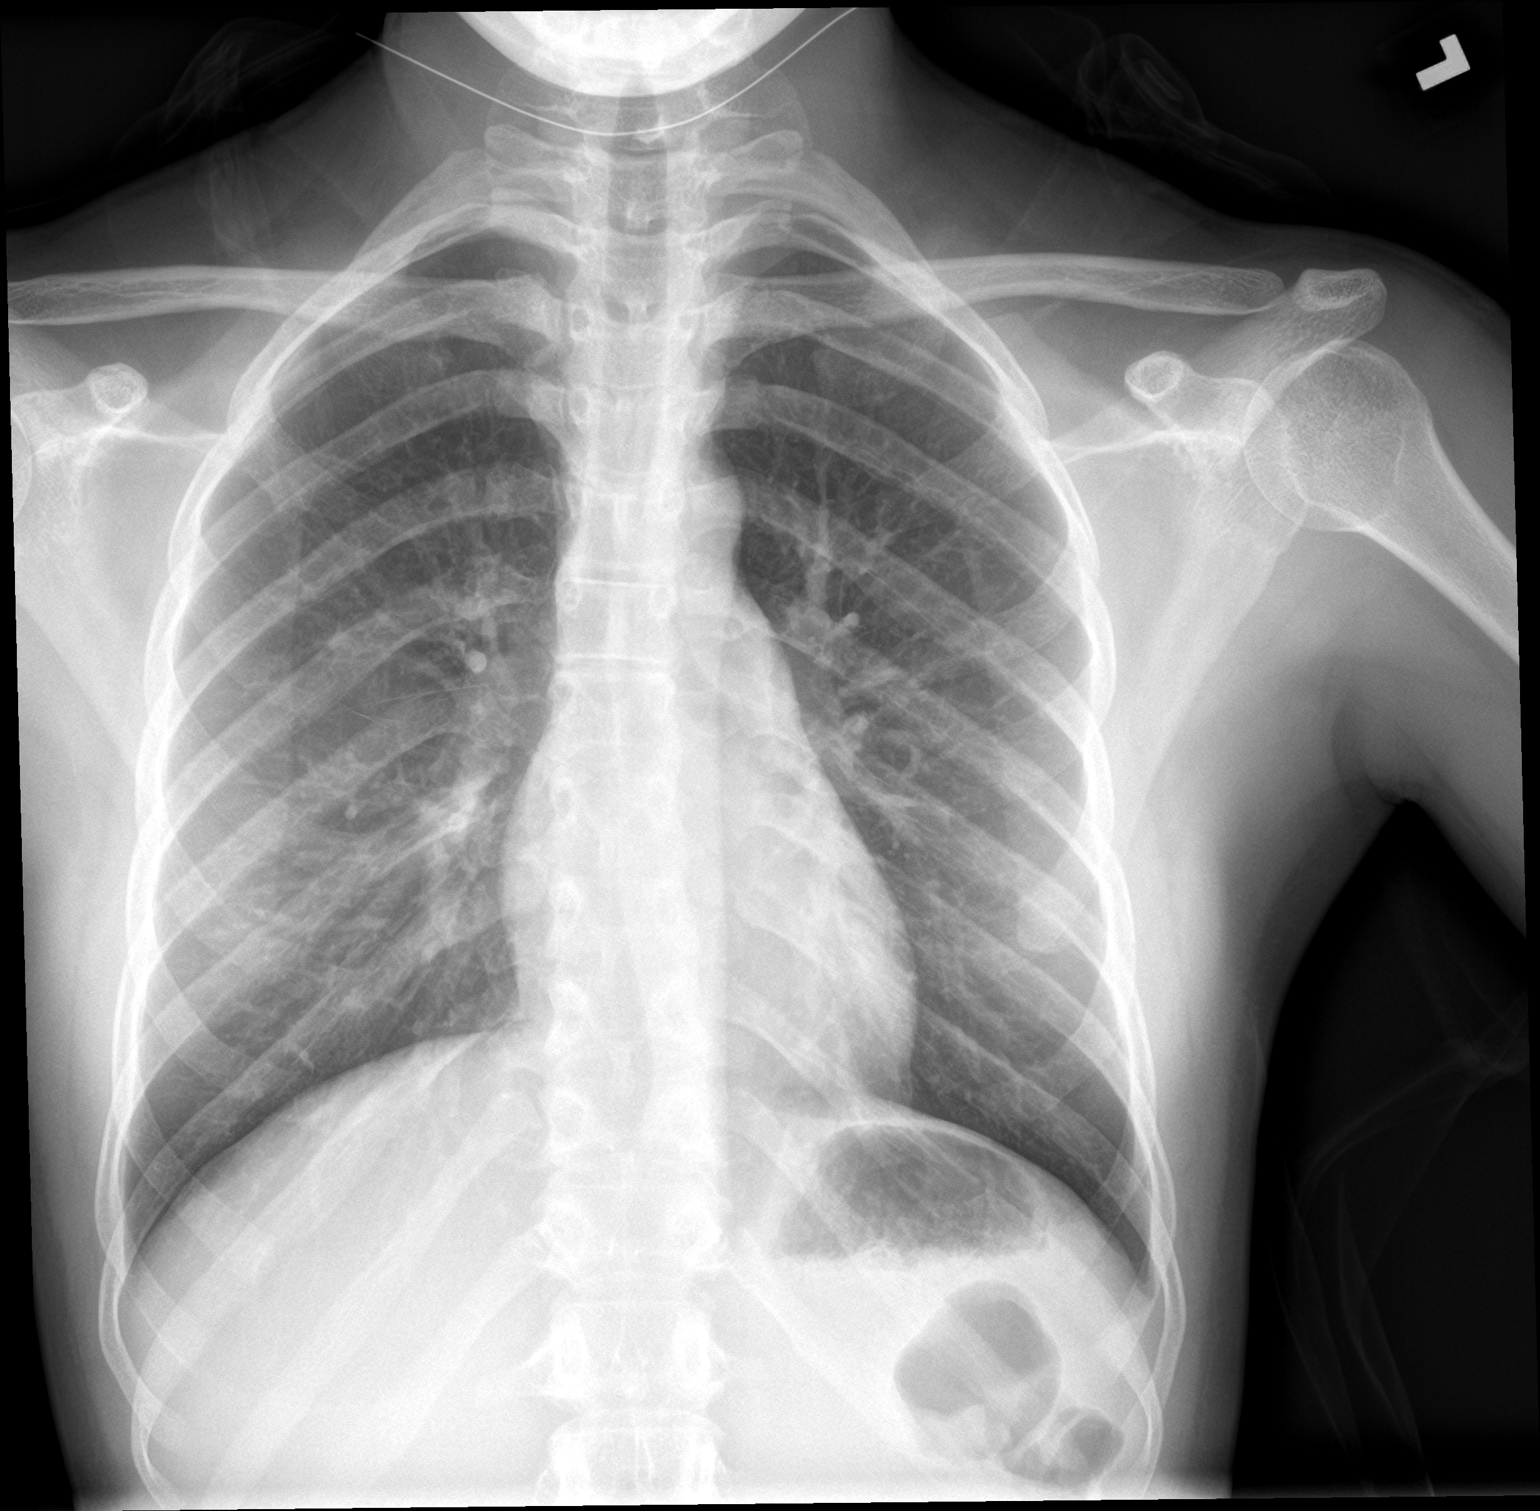

[chest lat]
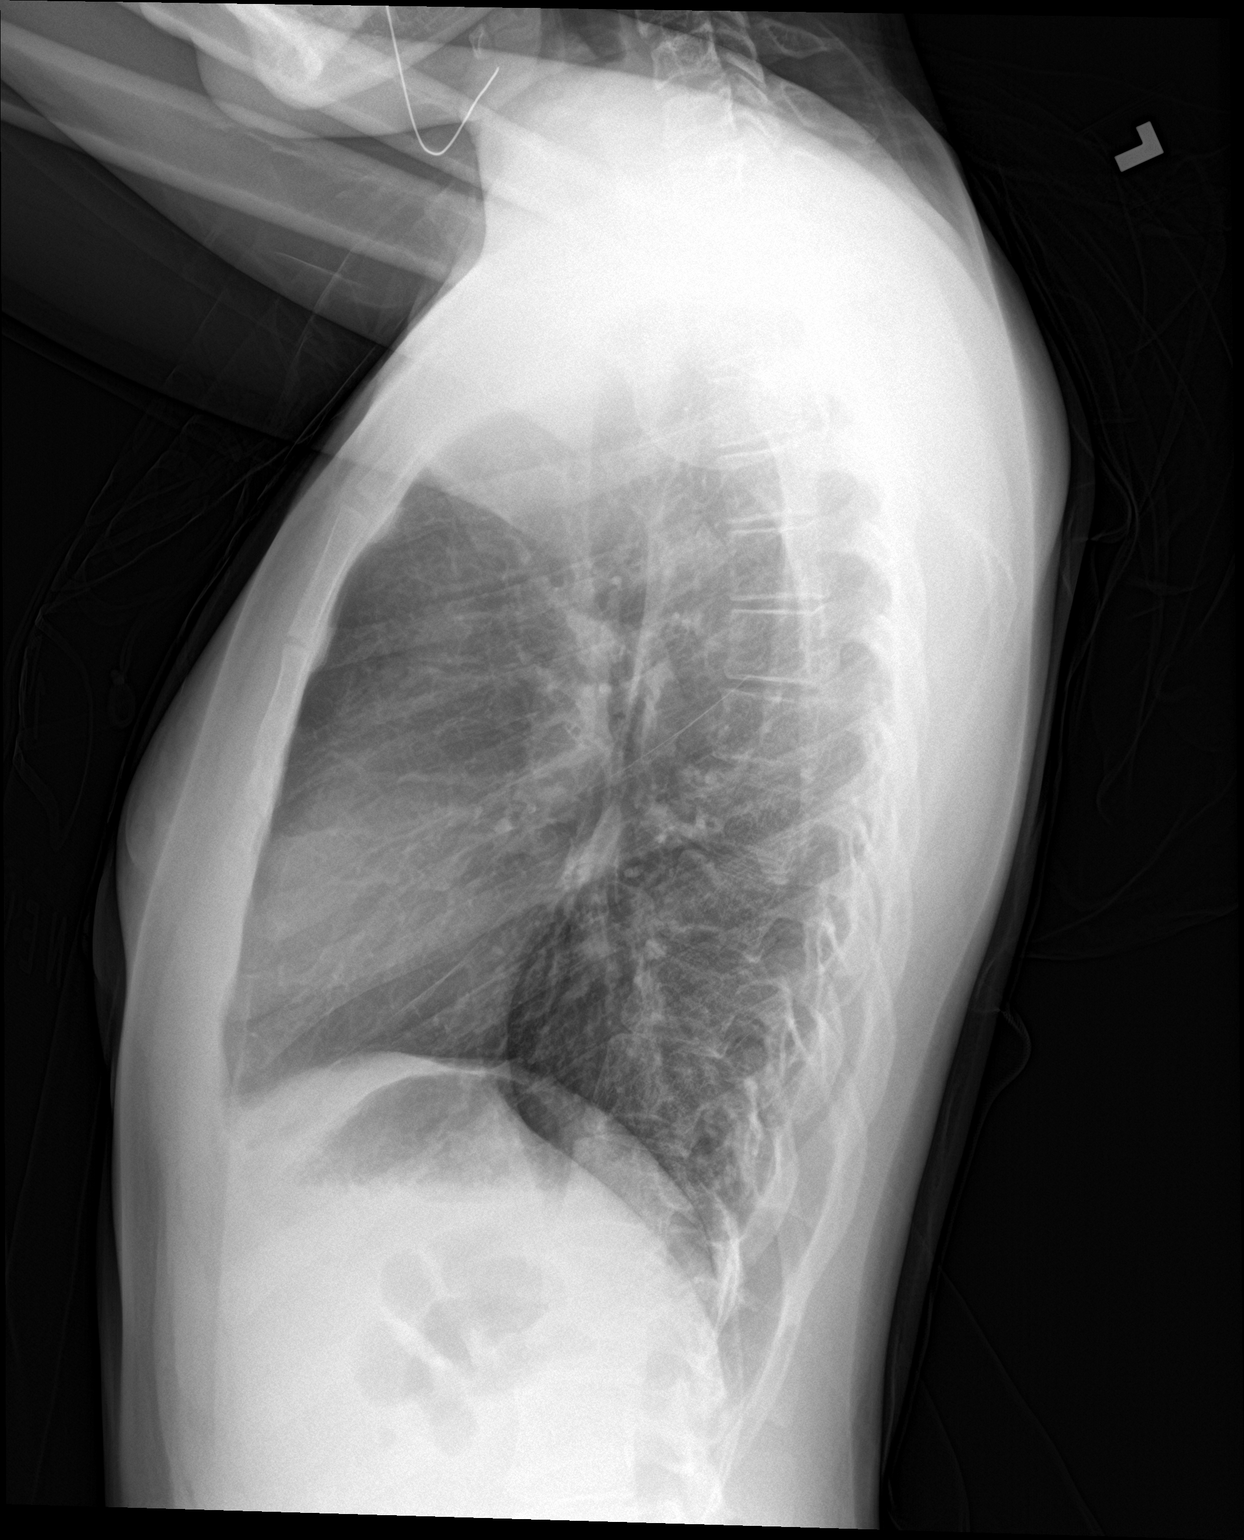

[2 of 2 positions shown; findings below may reference images not displayed]

FINDINGS: Normal heart size. Normal mediastinal contour. No pneumothorax. No
pleural effusion. Lungs appear clear, with no acute consolidative
airspace disease and no pulmonary edema.
IMPRESSION: No active cardiopulmonary disease.

## 2021-04-30 ENCOUNTER — Other Ambulatory Visit: Payer: Self-pay | Admitting: Obstetrics & Gynecology

## 2021-05-08 ENCOUNTER — Telehealth: Payer: Self-pay

## 2021-05-08 ENCOUNTER — Other Ambulatory Visit: Payer: Self-pay | Admitting: Obstetrics & Gynecology

## 2021-05-08 MED ORDER — DESOGESTREL-ETHINYL ESTRADIOL 0.15-30 MG-MCG PO TABS: 1.0000 | ORAL_TABLET | Freq: Every day | ORAL | 3 refills | Status: DC

## 2021-05-08 NOTE — Telephone Encounter (Signed)
Patient called needing refills on apri ph# [phone]

## 2021-05-08 NOTE — Telephone Encounter (Signed)
refilled 

## 2022-01-03 ENCOUNTER — Other Ambulatory Visit: Payer: Self-pay | Admitting: Obstetrics & Gynecology

## 2023-08-06 ENCOUNTER — Ambulatory Visit (INDEPENDENT_AMBULATORY_CARE_PROVIDER_SITE_OTHER): Payer: BC Managed Care – PPO | Admitting: Obstetrics & Gynecology

## 2023-08-06 ENCOUNTER — Encounter: Payer: Self-pay | Admitting: Obstetrics & Gynecology

## 2023-08-06 ENCOUNTER — Other Ambulatory Visit (HOSPITAL_COMMUNITY)
Admission: RE | Admit: 2023-08-06 | Discharge: 2023-08-06 | Disposition: A | Discharge status: Home / Self Care | Payer: BC Managed Care – PPO | Source: Ambulatory Visit | Attending: Obstetrics & Gynecology | Admitting: Obstetrics & Gynecology

## 2023-08-06 VITALS — BP 123/81 | HR 73 | Ht 60.0 in | Wt 113.0 lb

## 2023-08-06 DIAGNOSIS — D241 Benign neoplasm of right breast: Secondary | ICD-10-CM

## 2023-08-06 DIAGNOSIS — Z01419 Encounter for gynecological examination (general) (routine) without abnormal findings: Secondary | ICD-10-CM

## 2023-08-06 MED ORDER — APRI 0.15-30 MG-MCG PO TABS: 1.0000 | ORAL_TABLET | Freq: Every day | ORAL | 3 refills | Status: DC

## 2023-08-06 NOTE — Progress Notes (Signed)
Subjective:     Sherri Chang is a 27 y.o. female here for a routine exam.  No LMP recorded. (Menstrual status: Oral contraceptives). [id number] Birth Control Method:  COC Menstrual Calendar(currently): regular with some BTB (pinkinsh)  Current complaints: none, persistent area right breast, no change over 6 years.sonogram from 11/2017 was reviewed, since it is stable over all these years with no change by patient no further eval is required   Current acute medical issues:  none   Recent Gynecologic History No LMP recorded. (Menstrual status: Oral contraceptives). 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   Migraines     Past Surgical History:  Procedure Laterality Date   NO PAST SURGERIES      OB History     Gravida  0   Para  0   Term  0   Preterm  0   AB  0   Living  0      SAB  0   IAB  0   Ectopic  0   Multiple  0   Live Births  0           Social History   Socioeconomic History   Marital status: Single    Spouse name: Not on file   Number of children: Not on file   Years of education: Not on file   Highest education level: Not on file  Occupational History   Not on file  Tobacco Use   Smoking status: Never   Smokeless tobacco: Never  Vaping Use   Vaping status: Never Used  Substance and Sexual Activity   Alcohol use: No   Drug use: No   Sexual activity: Yes    Birth control/protection: Pill  Other Topics Concern   Not on file  Social History Narrative   Not on file   Social Determinants of Health   Financial Resource Strain: Not on file  Food Insecurity: Not on file  Transportation Needs: Not on file  Physical Activity: Not on file  Stress: Not on file  Social Connections: Not on file    History reviewed. No pertinent family history.   Current Outpatient Medications:    desogestrel-ethinyl estradiol (APRI) 0.15-30 MG-MCG  tablet, Take 1 tablet by mouth daily., Disp: 84 tablet, Rfl: 3  Review of Systems  Review of Systems  Constitutional: Negative for fever, chills, weight loss, malaise/fatigue and diaphoresis.  HENT: Negative for hearing loss, ear pain, nosebleeds, congestion, sore throat, neck pain, tinnitus and ear discharge.   Eyes: Negative for blurred vision, double vision, photophobia, pain, discharge and redness.  Respiratory: Negative for cough, hemoptysis, sputum production, shortness of breath, wheezing and stridor.   Cardiovascular: Negative for chest pain, palpitations, orthopnea, claudication, leg swelling and PND.  Gastrointestinal: negative for abdominal pain. Negative for heartburn, nausea, vomiting, diarrhea, constipation, blood in stool and melena.  Genitourinary: Negative for dysuria, urgency, frequency, hematuria and flank pain.  Musculoskeletal: Negative for myalgias, back pain, joint pain and falls.  Skin: Negative for itching and rash.  Neurological: Negative for dizziness, tingling, tremors, sensory change, speech change, focal weakness, seizures, loss of consciousness, weakness and headaches.  Endo/Heme/Allergies: Negative for environmental allergies and polydipsia. Does not bruise/bleed easily.  Psychiatric/Behavioral: Negative for depression, suicidal ideas, hallucinations, memory loss and substance abuse. The patient is not nervous/anxious and does not have insomnia.        Objective:  Blood pressure 123/81, pulse 73, height 5' (1.524 m), weight 113 lb (51.3 kg).   Physical Exam  Vitals reviewed. Constitutional: She is oriented to person, place, and time. She appears well-developed and well-nourished.  HENT:  Head: Normocephalic and atraumatic.        Right Ear: External ear normal.  Left Ear: External ear normal.  Nose: Nose normal.  Mouth/Throat: Oropharynx is clear and moist.  Eyes: Conjunctivae and EOM are normal. Pupils are equal, round, and reactive to light. Right eye  exhibits no discharge. Left eye exhibits no discharge. No scleral icterus.  Neck: Normal range of motion. Neck supple. No tracheal deviation present. No thyromegaly present.  Cardiovascular: Normal rate, regular rhythm, normal heart sounds and intact distal pulses.  Exam reveals no gallop and no friction rub.   No murmur heard. Respiratory: Effort normal and breath sounds normal. No respiratory distress. She has no wheezes. She has no rales. She exhibits no tenderness.  GI: Soft. Bowel sounds are normal. She exhibits no distension and no mass. There is no tenderness. There is no rebound and no guarding.  Genitourinary:  Breasts no new masses, stable area over 6 years skin changes or nipple changes bilaterally      Vulva is normal without lesions Vagina is pink moist without discharge Cervix normal in appearance and pap is done Uterus is normal size shape and contour Adnexa is negative with normal sized ovaries   Musculoskeletal: Normal range of motion. She exhibits no edema and no tenderness.  Neurological: She is alert and oriented to person, place, and time. She has normal reflexes. She displays normal reflexes. No cranial nerve deficit. She exhibits normal muscle tone. Coordination normal.  Skin: Skin is warm and dry. No rash noted. No erythema. No pallor.  Psychiatric: She has a normal mood and affect. Her behavior is normal. Judgment and thought content normal.       Medications Ordered at today's visit: Meds ordered this encounter  Medications   desogestrel-ethinyl estradiol (APRI) 0.15-30 MG-MCG tablet    Sig: Take 1 tablet by mouth daily.    Dispense:  84 tablet    Refill:  3    Other orders placed at today's visit: No orders of the defined types were placed in this encounter.     Assessment:    Normal Gyn exam.   Fibrocystic vs fibroadneoma, stable for 5 years Plan:    Contraception: OCP (estrogen/progesterone). Follow up in: 3 years.     No follow-ups on  file.

## 2023-08-08 LAB — CYTOLOGY - PAP
Chlamydia: NEGATIVE
Comment: NEGATIVE
Comment: NEGATIVE
Comment: NORMAL
Diagnosis: NEGATIVE
High risk HPV: NEGATIVE
Neisseria Gonorrhea: NEGATIVE

## 2023-12-19 ENCOUNTER — Encounter: Payer: Self-pay | Admitting: Obstetrics & Gynecology

## 2023-12-19 DIAGNOSIS — D241 Benign neoplasm of right breast: Secondary | ICD-10-CM

## 2023-12-24 ENCOUNTER — Encounter: Payer: Self-pay | Admitting: Surgery

## 2023-12-24 ENCOUNTER — Ambulatory Visit: Payer: 59 | Admitting: Surgery

## 2023-12-24 VITALS — BP 129/93 | HR 78 | Temp 98.6°F | Resp 12 | Ht 60.0 in | Wt 105.0 lb

## 2023-12-24 DIAGNOSIS — N6341 Unspecified lump in right breast, subareolar: Secondary | ICD-10-CM | POA: Diagnosis not present

## 2023-12-24 DIAGNOSIS — D241 Benign neoplasm of right breast: Secondary | ICD-10-CM

## 2023-12-26 ENCOUNTER — Ambulatory Visit (HOSPITAL_COMMUNITY)
Admission: RE | Admit: 2023-12-26 | Discharge: 2023-12-26 | Disposition: A | Discharge status: Home / Self Care | Payer: 59 | Source: Ambulatory Visit | Attending: Surgery | Admitting: Surgery

## 2023-12-26 ENCOUNTER — Other Ambulatory Visit (HOSPITAL_COMMUNITY): Payer: Self-pay | Admitting: Surgery

## 2023-12-26 DIAGNOSIS — D241 Benign neoplasm of right breast: Secondary | ICD-10-CM | POA: Insufficient documentation

## 2023-12-26 DIAGNOSIS — N6341 Unspecified lump in right breast, subareolar: Secondary | ICD-10-CM | POA: Diagnosis present

## 2023-12-26 NOTE — Progress Notes (Signed)
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 Despina Hidden  Chief Complaint   New Patient (Initial Visit)     Sherri Chang is a 28 y.o. female.  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  She states that has been present for at least 5 years.  The area gets sore with sleeping and with pressure to the area.  She thinks that it gets worse when she is on her period.  She has previously had an ultrasound which was consistent with a fibroadenoma but has not had any imaging since that time.  The patient has no history of any other masses, lumps, bumps, nipple changes or discharge. She had menarche at age 67, and she has never been pregnant. She is G0P0.  She has a history of breast cancer and multiple paternal great aunts and in her maternal great grandmother.  She has never had any previous biopsies or concerning areas on mammogram.  She has not had any chest radiation.  She has no significant past medical history.  She denies use of any blood thinning medications.  She had some type of neck lymph node drainage procedure but otherwise has had no other surgeries.  She denies use of tobacco products, alcohol, and illicit drugs.   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  Social History   Tobacco Use   Smoking status: Never   Smokeless tobacco: Never  Vaping Use   Vaping status: Never Used  Substance Use Topics   Alcohol use: No   Drug use: No    Medications: I have reviewed the patient's current medications. Allergies as of 12/24/2023       Reactions   Latex Itching   HANDS TURN RED        Medication List        Accurate as of December 24, 2023 11:59 PM. If you have any questions, ask your nurse or  doctor.          Apri 0.15-30 MG-MCG tablet Generic drug: desogestrel-ethinyl estradiol Take 1 tablet by mouth daily.         ROS:  Constitutional: negative for chills, fatigue, and fevers Eyes: negative for visual disturbance and pain Ears, nose, mouth, throat, and face: negative for ear drainage, sore throat, and sinus problems Respiratory: negative for cough, wheezing, and shortness of breath Cardiovascular: negative for chest pain and palpitations Gastrointestinal: negative for abdominal pain, nausea, reflux symptoms, and vomiting Genitourinary:negative for dysuria and frequency Integument/breast: negative for dryness and rash Hematologic/lymphatic: negative for bleeding and lymphadenopathy Musculoskeletal:negative for back pain and neck pain Neurological: negative for dizziness and tremors Endocrine: negative for temperature intolerance  Blood pressure (!) 129/93, pulse 78, temperature 98.6 F (37 C), temperature source Oral, resp. rate 12, height 5' (1.524 m), weight 105 lb (47.6 kg), SpO2 97%. Physical Exam Vitals reviewed.  Constitutional:      Appearance: Normal appearance.  HENT:     Head: Normocephalic and atraumatic.  Eyes:     Extraocular Movements: Extraocular movements intact.     Pupils: Pupils are equal, round, and reactive to light.  Cardiovascular:     Rate and Rhythm: Normal rate and regular rhythm.  Pulmonary:     Effort: Pulmonary effort is  normal.     Breath sounds: Normal breath sounds.  Chest:  Breasts:    Right: Mass present. No inverted nipple, nipple discharge, skin change or tenderness.     Left: No inverted nipple, mass, nipple discharge, skin change or tenderness.     Comments: Right breast with a palpable mass in the subareolar region; small 1 cm palpable mass at the 7 o'clock region in the location of the previously diagnosed fibroadenoma; no palpable abnormalities of the left breast Abdominal:     General: There is no distension.      Palpations: Abdomen is soft.     Tenderness: There is no abdominal tenderness.  Musculoskeletal:        General: Normal range of motion.     Cervical back: Normal range of motion.  Lymphadenopathy:     Upper Body:     Right upper body: No supraclavicular or axillary adenopathy.     Left upper body: No supraclavicular or axillary adenopathy.  Skin:    General: Skin is warm and dry.  Neurological:     General: No focal deficit present.     Mental Status: She is alert and oriented to person, place, and time.  Psychiatric:        Mood and Affect: Mood normal.        Behavior: Behavior normal.     Results: Right breast and axillary ultrasound (11/19/2017): IMPRESSION: Benign appearance of palpable mass in the 7 o'clock location of the right breast, likely representing a benign fibroadenoma. We discussed management options including excision, biopsy, and close follow-up. Imaging followup is recommended at 6, 12, and 24 months to assess stability. The patient concurs with this plan.   RECOMMENDATION: Right breast ultrasound is recommended in 6 months.   I have discussed the findings and recommendations with the patient. Results were also provided in writing at the conclusion of the visit. If applicable, a reminder letter will be sent to the patient regarding the next appointment.   BI-RADS CATEGORY  3: Probably benign.   Assessment & Plan:  Sherri Chang is a 28 y.o. female who presents for evaluation of likely right breast fibroadenoma.  -Given that there is a new mass in the retroareolar area that is causing the patient pain, I believe that we need to proceed with breast imaging to fully evaluate the area.  Given her age, I would recommend starting with a ultrasound.  Order has been placed for the ultrasound. -While we are not proceeding for any surgical interventions at this exact moment, I did discuss the risk and benefits of right breast mass excision including but not  limited to bleeding, infection, injury to surrounding structures, and need for additional procedures.   -Will await the results of the breast ultrasound prior to proceeding with any surgical interventions -Information provided to the patient regarding fibroadenomas  All questions were answered to the satisfaction of the patient.  Theophilus Kinds, DO [facility] [address] [place], [place] [address] [phone] (office)

## 2024-01-07 ENCOUNTER — Ambulatory Visit (HOSPITAL_COMMUNITY)
Admission: RE | Admit: 2024-01-07 | Discharge: 2024-01-07 | Disposition: A | Discharge status: Home / Self Care | Payer: 59 | Source: Ambulatory Visit | Attending: Surgery | Admitting: Surgery

## 2024-01-07 ENCOUNTER — Encounter (HOSPITAL_COMMUNITY): Payer: Self-pay

## 2024-01-07 DIAGNOSIS — D241 Benign neoplasm of right breast: Secondary | ICD-10-CM | POA: Diagnosis present

## 2024-01-07 HISTORY — PX: BREAST BIOPSY: SHX20

## 2024-01-07 MED ORDER — LIDOCAINE HCL (PF) 2 % IJ SOLN
10.0000 mL | Freq: Once | INTRAMUSCULAR | Status: AC
  Administered 2024-01-07: 10 mL

## 2024-01-07 MED ORDER — LIDOCAINE-EPINEPHRINE (PF) 1 %-1:200000 IJ SOLN
INTRAMUSCULAR | Status: AC
  Filled 2024-01-07: qty 30

## 2024-01-07 MED ORDER — LIDOCAINE-EPINEPHRINE (PF) 1 %-1:200000 IJ SOLN
10.0000 mL | Freq: Once | INTRAMUSCULAR | Status: AC
  Administered 2024-01-07: 10 mL via INTRADERMAL

## 2024-01-07 MED ORDER — LIDOCAINE HCL (PF) 2 % IJ SOLN
INTRAMUSCULAR | Status: AC
  Filled 2024-01-07: qty 10

## 2024-01-07 NOTE — Progress Notes (Signed)
 PT tolerated right breast biopsy well today with NAD noted. PT verbalized understanding of discharge instructions and given ice packs to use at home. Colette from ultrasound delivered specimens to lab at this time for processing.

## 2024-01-08 LAB — SURGICAL PATHOLOGY

## 2024-01-16 ENCOUNTER — Ambulatory Visit: Payer: 59 | Admitting: Surgery

## 2024-01-16 ENCOUNTER — Encounter: Payer: Self-pay | Admitting: Surgery

## 2024-01-16 VITALS — BP 116/81 | HR 67 | Temp 98.4°F | Resp 14 | Ht 60.0 in | Wt 107.0 lb

## 2024-01-16 DIAGNOSIS — D241 Benign neoplasm of right breast: Secondary | ICD-10-CM | POA: Diagnosis not present

## 2024-01-16 NOTE — Progress Notes (Signed)
 [facility] Note   HPI:  28 y.o. Female presents to clinic for follow-up after right breast ultrasound and ultrasound-guided biopsy.  The final results of her biopsy demonstrated fibroadenoma for the retroareolar mass.  Patient is here to discuss excision of both the retroareolar and the 7 o'clock position fibroadenomas.  She has had pain associated with the retroareolar mass, but otherwise has no new acute complaints.  She has no new medical history and has not had any other procedures than the biopsy.  She confirms a little bit of milky discharge from her right breast.  Review of Systems:  All other review of systems: otherwise negative   Vital Signs:  BP 116/81   Pulse 67   Temp 98.4 F (36.9 C) (Oral)   Resp 14   Ht 5' (1.524 m)   Wt 107 lb (48.5 kg)   SpO2 98%   BMI 20.90 kg/m    Physical Exam:  Physical Exam Vitals reviewed.  Constitutional:      Appearance: Normal appearance.  Chest:  Breasts:    Right: Mass, nipple discharge and tenderness present. No inverted nipple or skin change.     Left: No inverted nipple, mass, nipple discharge, skin change or tenderness.     Comments: Right breast with a retroareolar mass is palpable closer to the 12 o'clock position just below the nipple, small amount of milky discharge noted at the nipple; small 1 cm palpable mass at the 7 o'clock position Lymphadenopathy:     Upper Body:     Right upper body: No axillary adenopathy.     Left upper body: No axillary adenopathy.  Neurological:     Mental Status: She is alert.     Laboratory studies: None  Imaging:  Right breast ultrasound (12/26/2023): IMPRESSION: Palpable and tender 1.3 cm mass in the right breast at 12 o'clock retroareolar.   RECOMMENDATION: The option of short-term follow-up versus proceeding with ultrasound-guided core biopsy was discussed with the patient. The patient prefers biopsy at this time and it will be scheduled for the patient at her  convenience.   I have discussed the findings and recommendations with the patient. If applicable, a reminder letter will be sent to the patient regarding the next appointment.   BI-RADS CATEGORY  4: Suspicious.  Right breast ultrasound-guided biopsy (01/07/2024): IMPRESSION: Ultrasound guided biopsy of the mass in the right breast at 12 o'clock retroareolar. No apparent complications.  ADDENDUM: PATHOLOGY revealed: A. BREAST, RIGHT, 12 OC, BIOPSY: - FIBROADENOMA.   Pathology results are CONCORDANT with imaging findings, per Dr. Edwin Cap.   Pathology results and recommendations were discussed with patient via telephone on 01/08/2024 by Randa Lynn RN. Patient reported biopsy site doing well with no adverse symptoms, and only slight tenderness at the site. Post biopsy care instructions were reviewed, questions were answered and my direct phone number was provided. Patient was instructed to call [facility] Mammography Department for any additional questions or concerns related to biopsy site.   RECOMMENDATION: Clinical follow-up with provider for persistent or subsequent concerns, and begin bilateral screening mammogram at age 38. Patient stated she discussed excision with provider/surgeon (Dr. Santina Evans Analeigha Nauman) and will follow-up with her to arrange surgical date.  Assessment:  28 y.o. yo Female who presents to discuss excision of 2 right breast fibroadenomas.  Plan:  -We again discussed her pathology noted from her right breast ultrasound guided biopsy.  I discussed that fibroadenoma is a benign finding and that we can  excise the areas if they are causing her pain -The risk and benefits of right breast lumpectomy x 2 were discussed including but not limited to bleeding, infection, injury to surrounding structures, need for additional procedures.  After careful consideration, Sherri Chang has decided to proceed with surgery.  -Patient tentatively  scheduled for surgery on 4/25 -Information provided to the patient regarding fibroadenomas and lumpectomies  All of the above recommendations were discussed with the patient, and all of patient's questions were answered to her expressed satisfaction.  Note: Portions of this report may have been transcribed using voice recognition software. Every effort has been made to ensure accuracy; however, inadvertent computerized transcription errors may still be present.   Theophilus Kinds, DO [facility] [address] [place] [place], [place] [address] [phone] (office)

## 2024-02-05 ENCOUNTER — Telehealth: Payer: Self-pay | Admitting: *Deleted

## 2024-02-05 NOTE — Telephone Encounter (Signed)
 Surgical Date: 03/06/2024 Procedure: Excision of Breast Biopsy, Right( Retroareolar and 7 o'clock position)   Received call from patient [phone]~ telephone.  Reports that she requires work note for upcoming surgery indicating how long she will be out of work.   Note transcribed with tentative leave of 2- 4 weeks pending postop follow ups.

## 2024-02-26 ENCOUNTER — Other Ambulatory Visit (HOSPITAL_COMMUNITY)

## 2024-03-02 NOTE — Patient Instructions (Signed)
 Sherri Chang  03/02/2024     @PREFPERIOPPHARMACY @   Your procedure is scheduled on  03/06/2024.   Report to [facility] at  0600  A.M.   Call this number if you have problems the morning of surgery:  [phone]  If you experience any cold or flu symptoms such as cough, fever, chills, shortness of breath, etc. between now and your scheduled surgery, please notify us  at the above number.   Remember:  Do not eat after midnight.   You may drink clear liquids until 0330 am on 03/06/2024.      Clear liquids allowed are:                    Water, Juice (No red color; non-citric and without pulp; diabetics please choose diet or no sugar options), Carbonated beverages (diabetics please choose diet or no sugar options), Clear Tea (No creamer, milk, or cream, including half & half and powdered creamer), Black Coffee Only (No creamer, milk or cream, including half & half and powdered creamer), and Clear Sports drink (No red color; diabetics please choose diet or no sugar options)    Take these medicines the morning of surgery with A SIP OF WATER                                                         none.    Do not wear jewelry, make-up or nail polish, including gel polish,  artificial nails, or any other type of covering on natural nails (fingers and  toes).  Do not wear lotions, powders, or perfumes, or deodorant.  Do not shave 48 hours prior to surgery.  Men may shave face and neck.  Do not bring valuables to the hospital.  [facility] is not responsible for any belongings or valuables.  Contacts, dentures or bridgework may not be worn into surgery.  Leave your suitcase in the car.  After surgery it may be brought to your room.  For patients admitted to the hospital, discharge time will be determined by your treatment team.  Patients discharged the day of surgery will not be allowed to drive home and must have someone with them for 24 hours.    Special instructions:    DO NOT smoke tobacco or vape for 24 hours before your procedure.  Please read over the following fact sheets that you were given. Coughing and Deep Breathing, Surgical Site Infection Prevention, Anesthesia Post-op Instructions, and Care and Recovery After Surgery      Breast Biopsy, Care After The following information offers guidance on how to care for yourself after your breast biopsy. Your doctor may also give you more specific instructions. If you have problems or questions, contact your doctor. What can I expect after the procedure? After a breast biopsy, it is common to have: Bruising on your breast. Breast swelling. Numbness, tingling, or pain near your biopsy site. This site is where tissue was taken out for study. Follow these instructions at home: Medicines Take over-the-counter and prescription medicines only as told by your doctor. If you were given a sedative during your procedure, do not drive or use machines until your doctor says that it is safe. A sedative is a medicine that helps you relax. Do not drink  alcohol while taking pain medicine. Ask your doctor if you should avoid driving or using machines while you are taking your medicine. Biopsy site care     Follow instructions from your doctor about how to take care of your cut from surgery (incision) or your puncture site. Make sure you: Wash your hands with soap and water for at least 20 seconds before and after you change your bandage. If you cannot use soap and water, use hand sanitizer. Change your bandage. Leave stitches or skin glue in place for at least 2 weeks. Leave tape strips alone unless you are told to take them off. You may trim the edges of the tape strips if they curl up. If you have stitches, keep them dry when you take a bath or a shower. Check your cut or puncture site every day for signs of infection. Look for: More redness, swelling, or pain. More fluid or blood. Warmth. Pus or a bad  smell. Protect the biopsy site. Do not let the site get bumped. Managing pain If told, put ice on the biopsy site. To do this: Put ice in a plastic bag. Place a towel between your skin and the bag. Leave the ice on for 20 minutes, 2-3 times a day. Take off the ice if your skin turns bright red. This is very important. If you cannot feel pain, heat, or cold, you have a greater risk of damage to the area. Activity If a cut was made in your skin to do the biopsy, avoid activities that could pull your cut open. These include: Stretching. Reaching over your head. Exercise. Sports. Lifting anything that weighs more than 3 lb (1.4 kg). Return to your normal activities when your doctor says that it is safe. General instructions Follow your normal diet. Wear a good support bra for as long as told by your doctor. Get checked for extra fluid around your lymph nodes (lymphedema) as often as told. Do not smoke or use any products that contain nicotine or tobacco. If you need help quitting, ask your doctor. Keep all follow-up visits. Contact a doctor if: You notice any of these at or near the biopsy site: More redness, swelling, or pain. More fluid or blood. Warmth. Pus or a bad smell. The site breaking open after the stitches or skin tape strips have been removed. You have a rash or a fever. Get help right away if: You have trouble breathing. You have red streaks around the biopsy site. Summary After a breast biopsy, it is common to have bruising, numbness, tingling, or pain near your biopsy site. Ask your doctor if you should avoid driving or using machines while you are taking your medicine. If you had a cut made in your skin to do the biopsy, avoid activities that may pull the cut open. Return to your normal activities when your doctor says that it is safe. Wear a good support bra for as long as told by your doctor. This information is not intended to replace advice given to you by your  health care provider. Make sure you discuss any questions you have with your health care provider. Document Revised: 08/23/2021 Document Reviewed: 08/23/2021 Elsevier Patient Education  2024 [employer].General Anesthesia, Adult, Care After The following information offers guidance on how to care for yourself after your procedure. Your health care provider may also give you more specific instructions. If you have problems or questions, contact your health care provider. What can I expect after the procedure? After  the procedure, it is common for people to: Have pain or discomfort at the IV site. Have nausea or vomiting. Have a sore throat or hoarseness. Have trouble concentrating. Feel cold or chills. Feel weak, sleepy, or tired (fatigue). Have soreness and body aches. These can affect parts of the body that were not involved in surgery. Follow these instructions at home: For the time period you were told by your health care provider:  Rest. Do not participate in activities where you could fall or become injured. Do not drive or use machinery. Do not drink alcohol. Do not take sleeping pills or medicines that cause drowsiness. Do not make important decisions or sign legal documents. Do not take care of children on your own. General instructions Drink enough fluid to keep your urine pale yellow. If you have sleep apnea, surgery and certain medicines can increase your risk for breathing problems. Follow instructions from your health care provider about wearing your sleep device: Anytime you are sleeping, including during daytime naps. While taking prescription pain medicines, sleeping medicines, or medicines that make you drowsy. Return to your normal activities as told by your health care provider. Ask your health care provider what activities are safe for you. Take over-the-counter and prescription medicines only as told by your health care provider. Do not use any products that  contain nicotine or tobacco. These products include cigarettes, chewing tobacco, and vaping devices, such as e-cigarettes. These can delay incision healing after surgery. If you need help quitting, ask your health care provider. Contact a health care provider if: You have nausea or vomiting that does not get better with medicine. You vomit every time you eat or drink. You have pain that does not get better with medicine. You cannot urinate or have bloody urine. You develop a skin rash. You have a fever. Get help right away if: You have trouble breathing. You have chest pain. You vomit blood. These symptoms may be an emergency. Get help right away. Call 911. Do not wait to see if the symptoms will go away. Do not drive yourself to the hospital. Summary After the procedure, it is common to have a sore throat, hoarseness, nausea, vomiting, or to feel weak, sleepy, or fatigue. For the time period you were told by your health care provider, do not drive or use machinery. Get help right away if you have difficulty breathing, have chest pain, or vomit blood. These symptoms may be an emergency. This information is not intended to replace advice given to you by your health care provider. Make sure you discuss any questions you have with your health care provider. Document Revised: 01/26/2022 Document Reviewed: 01/26/2022 Elsevier Patient Education  2024 Else[employer] Inc.How to Use Chlorhexidine  at Home in the Shower Chlorhexidine  gluconate (CHG) is a germ-killing (antiseptic) wash that's used to clean the skin. It can get rid of the germs that normally live on the skin and can keep them away for about 24 hours. If you're having surgery, you may be told to shower with CHG at home the night before surgery. This can help lower your risk for infection. To use CHG wash in the shower, follow the steps below. Supplies needed: CHG body wash. Clean washcloth. Clean towel. How to use CHG in the shower Follow  these steps unless you're told to use CHG in a different way: Start the shower. Use your normal soap and shampoo to wash your face and hair. Turn off the shower or move out of the shower stream.  Pour CHG onto a clean washcloth. Do not use any type of brush or rough sponge. Start at your neck, washing your body down to your toes. Make sure you: Wash the part of your body where the surgery will be done for at least 1 minute. Do not scrub. Do not use CHG on your head or face unless your health care provider tells you to. If it gets into your ears or eyes, rinse them well with water. Do not wash your genitals with CHG. Wash your back and under your arms. Make sure to wash skin folds. Let the CHG sit on your skin for 1-2 minutes or as long as told. Rinse your entire body in the shower, including all body creases and folds. Turn off the shower. Dry off with a clean towel. Do not put anything on your skin afterward, such as powder, lotion, or perfume. Put on clean clothes or pajamas. If it's the night before surgery, sleep in clean sheets. General tips Use CHG only as told, and follow the instructions on the label. Use the full amount of CHG as told. This is often one bottle. Do not smoke and stay away from flames after using CHG. Your skin may feel sticky after using CHG. This is normal. The sticky feeling will go away as the CHG dries. Do not use CHG: If you have a chlorhexidine  allergy or have reacted to chlorhexidine  in the past. On open wounds or areas of skin that have broken skin, cuts, or scrapes. On babies younger than 29 months of age. Contact a health care provider if: You have questions about using CHG. Your skin gets irritated or itchy. You have a rash after using CHG. You swallow any CHG. Call your local poison control center [phone] in the [place] Your eyes itch badly, or they become very red or swollen. Your hearing changes. You have trouble seeing. If you can't reach  your provider, go to an urgent care or emergency room. Do not drive yourself. Get help right away if: You have swelling or tingling in your mouth or throat. You make high-pitched whistling sounds when you breathe, most often when you breathe out (wheeze). You have trouble breathing. These symptoms may be an emergency. Call 911 right away. Do not wait to see if the symptoms will go away. Do not drive yourself to the hospital. This information is not intended to replace advice given to you by your health care provider. Make sure you discuss any questions you have with your health care provider. Document Revised: 05/14/2023 Document Reviewed: 05/10/2022 Elsevier Patient Education  2024 [employer].

## 2024-03-04 ENCOUNTER — Encounter (HOSPITAL_COMMUNITY)
Admission: RE | Admit: 2024-03-04 | Discharge: 2024-03-04 | Disposition: A | Discharge status: Home / Self Care | Source: Ambulatory Visit | Attending: Surgery | Admitting: Surgery

## 2024-03-04 ENCOUNTER — Encounter (HOSPITAL_COMMUNITY): Payer: Self-pay

## 2024-03-04 VITALS — BP 108/80 | HR 80 | Temp 97.8°F | Resp 16 | Ht 60.0 in | Wt 100.3 lb

## 2024-03-04 DIAGNOSIS — Z01812 Encounter for preprocedural laboratory examination: Secondary | ICD-10-CM | POA: Diagnosis present

## 2024-03-04 DIAGNOSIS — Z01818 Encounter for other preprocedural examination: Secondary | ICD-10-CM

## 2024-03-04 LAB — POCT PREGNANCY, URINE: Preg Test, Ur: NEGATIVE

## 2024-03-06 ENCOUNTER — Encounter (HOSPITAL_COMMUNITY): Payer: Self-pay | Admitting: Surgery

## 2024-03-06 ENCOUNTER — Ambulatory Visit (HOSPITAL_BASED_OUTPATIENT_CLINIC_OR_DEPARTMENT_OTHER): Admitting: Anesthesiology

## 2024-03-06 ENCOUNTER — Ambulatory Visit (HOSPITAL_COMMUNITY): Admitting: Anesthesiology

## 2024-03-06 ENCOUNTER — Encounter (HOSPITAL_COMMUNITY)
Admission: RE | Disposition: A | Discharge status: Home / Self Care | Payer: Self-pay | Source: Home / Self Care | Attending: Surgery

## 2024-03-06 ENCOUNTER — Ambulatory Visit (HOSPITAL_COMMUNITY)
Admission: RE | Admit: 2024-03-06 | Discharge: 2024-03-06 | Disposition: A | Discharge status: Home / Self Care | Attending: Surgery | Admitting: Surgery

## 2024-03-06 DIAGNOSIS — D241 Benign neoplasm of right breast: Secondary | ICD-10-CM

## 2024-03-06 DIAGNOSIS — I10 Essential (primary) hypertension: Secondary | ICD-10-CM | POA: Diagnosis not present

## 2024-03-06 HISTORY — PX: BREAST LUMPECTOMY: SHX2

## 2024-03-06 SURGERY — BREAST LUMPECTOMY
Anesthesia: General | Site: Breast | Laterality: Right

## 2024-03-06 MED ORDER — ORAL CARE MOUTH RINSE: 15.0000 mL | Freq: Once | OROMUCOSAL | Status: DC

## 2024-03-06 MED ORDER — MIDAZOLAM HCL 2 MG/2ML IJ SOLN
INTRAMUSCULAR | Status: DC | PRN
  Administered 2024-03-06: 2 mg via INTRAVENOUS

## 2024-03-06 MED ORDER — MIDAZOLAM HCL 2 MG/2ML IJ SOLN
INTRAMUSCULAR | Status: AC
  Filled 2024-03-06: qty 2

## 2024-03-06 MED ORDER — CHLORHEXIDINE GLUCONATE 0.12 % MT SOLN: 15.0000 mL | Freq: Once | OROMUCOSAL | Status: AC

## 2024-03-06 MED ORDER — CHLORHEXIDINE GLUCONATE 0.12 % MT SOLN: 15.0000 mL | Freq: Once | OROMUCOSAL | Status: DC

## 2024-03-06 MED ORDER — DEXAMETHASONE SODIUM PHOSPHATE 10 MG/ML IJ SOLN
INTRAMUSCULAR | Status: DC | PRN
  Administered 2024-03-06: 5 mg via INTRAVENOUS

## 2024-03-06 MED ORDER — OXYCODONE HCL 5 MG PO TABS: 5.0000 mg | ORAL_TABLET | Freq: Four times a day (QID) | ORAL | 0 refills | Status: AC | PRN

## 2024-03-06 MED ORDER — CEFAZOLIN SODIUM-DEXTROSE 2-4 GM/100ML-% IV SOLN
2.0000 g | INTRAVENOUS | Status: AC
  Administered 2024-03-06: 2 g via INTRAVENOUS
  Filled 2024-03-06: qty 100

## 2024-03-06 MED ORDER — FENTANYL CITRATE (PF) 100 MCG/2ML IJ SOLN
INTRAMUSCULAR | Status: DC | PRN
  Administered 2024-03-06: 100 ug via INTRAVENOUS

## 2024-03-06 MED ORDER — ONDANSETRON HCL 4 MG/2ML IJ SOLN: 4.0000 mg | Freq: Once | INTRAMUSCULAR | Status: DC | PRN

## 2024-03-06 MED ORDER — PROPOFOL 10 MG/ML IV BOLUS
INTRAVENOUS | Status: DC | PRN
  Administered 2024-03-06: 150 mg via INTRAVENOUS

## 2024-03-06 MED ORDER — FENTANYL CITRATE PF 50 MCG/ML IJ SOSY
25.0000 ug | PREFILLED_SYRINGE | INTRAMUSCULAR | Status: DC | PRN
Start: 2024-03-06 — End: 2024-03-06
  Administered 2024-03-06 (×2): 25 ug via INTRAVENOUS
  Filled 2024-03-06: qty 1

## 2024-03-06 MED ORDER — ACETAMINOPHEN 500 MG PO TABS: 1000.0000 mg | ORAL_TABLET | Freq: Four times a day (QID) | ORAL | 0 refills | Status: AC

## 2024-03-06 MED ORDER — FENTANYL CITRATE (PF) 100 MCG/2ML IJ SOLN
INTRAMUSCULAR | Status: AC
  Filled 2024-03-06: qty 2

## 2024-03-06 MED ORDER — CHLORHEXIDINE GLUCONATE 0.12 % MT SOLN
OROMUCOSAL | Status: AC
  Administered 2024-03-06: 15 mL via OROMUCOSAL
  Filled 2024-03-06: qty 15

## 2024-03-06 MED ORDER — LACTATED RINGERS IV SOLN: INTRAVENOUS | Status: DC

## 2024-03-06 MED ORDER — OXYCODONE HCL 5 MG PO TABS: 5.0000 mg | ORAL_TABLET | Freq: Once | ORAL | Status: DC | PRN

## 2024-03-06 MED ORDER — ONDANSETRON HCL 4 MG/2ML IJ SOLN
INTRAMUSCULAR | Status: DC | PRN
  Administered 2024-03-06: 4 mg via INTRAVENOUS

## 2024-03-06 MED ORDER — LIDOCAINE 2% (20 MG/ML) 5 ML SYRINGE
INTRAMUSCULAR | Status: DC | PRN
  Administered 2024-03-06: 50 mg via INTRAVENOUS

## 2024-03-06 MED ORDER — CHLORHEXIDINE GLUCONATE CLOTH 2 % EX PADS
6.0000 | MEDICATED_PAD | Freq: Once | CUTANEOUS | Status: DC
  Administered 2024-03-06: 6 via TOPICAL

## 2024-03-06 MED ORDER — BUPIVACAINE HCL (PF) 0.5 % IJ SOLN
INTRAMUSCULAR | Status: DC | PRN
  Administered 2024-03-06: 20 mL

## 2024-03-06 MED ORDER — OXYCODONE HCL 5 MG/5ML PO SOLN: 5.0000 mg | Freq: Once | ORAL | Status: DC | PRN

## 2024-03-06 MED ORDER — ONDANSETRON HCL 4 MG PO TABS: 4.0000 mg | ORAL_TABLET | Freq: Every day | ORAL | 1 refills | Status: AC | PRN

## 2024-03-06 MED ORDER — CHLORHEXIDINE GLUCONATE CLOTH 2 % EX PADS
6.0000 | MEDICATED_PAD | Freq: Once | CUTANEOUS | Status: AC
  Administered 2024-03-06: 6 via TOPICAL

## 2024-03-06 MED ORDER — BUPIVACAINE HCL (PF) 0.5 % IJ SOLN
INTRAMUSCULAR | Status: AC
  Filled 2024-03-06: qty 30

## 2024-03-06 MED ORDER — PROPOFOL 10 MG/ML IV BOLUS
INTRAVENOUS | Status: AC
Start: 2024-03-06 — End: ?
  Filled 2024-03-06: qty 20

## 2024-03-06 MED ORDER — PROPOFOL 10 MG/ML IV BOLUS
INTRAVENOUS | Status: AC
  Filled 2024-03-06: qty 20

## 2024-03-06 MED ORDER — LACTATED RINGERS IV SOLN: INTRAVENOUS | Status: DC | PRN

## 2024-03-06 MED ORDER — DOCUSATE SODIUM 100 MG PO CAPS: 100.0000 mg | ORAL_CAPSULE | Freq: Two times a day (BID) | ORAL | 2 refills | Status: AC

## 2024-03-06 MED ORDER — SODIUM CHLORIDE 0.9 % IR SOLN
Status: DC | PRN
  Administered 2024-03-06: 1000 mL

## 2024-03-06 SURGICAL SUPPLY — 25 items
BINDER BREAST LRG (GAUZE/BANDAGES/DRESSINGS) IMPLANT
BLADE SURG 15 STRL LF DISP TIS (BLADE) ×1 IMPLANT
CHLORAPREP W/TINT 26 (MISCELLANEOUS) ×1 IMPLANT
CLOTH BEACON ORANGE TIMEOUT ST (SAFETY) ×1 IMPLANT
COVER LIGHT HANDLE STERIS (MISCELLANEOUS) ×2 IMPLANT
DERMABOND ADVANCED .7 DNX12 (GAUZE/BANDAGES/DRESSINGS) ×1 IMPLANT
ELECTRODE REM PT RTRN 9FT ADLT (ELECTROSURGICAL) ×1 IMPLANT
GLOVE BIOGEL PI IND STRL 6.5 (GLOVE) ×1 IMPLANT
GLOVE BIOGEL PI IND STRL 7.0 (GLOVE) ×2 IMPLANT
GLOVE SURG SS PI 6.5 STRL IVOR (GLOVE) ×1 IMPLANT
GLOVE SURG SS PI 7.0 STRL IVOR (GLOVE) IMPLANT
GOWN STRL REUS W/TWL LRG LVL3 (GOWN DISPOSABLE) ×2 IMPLANT
KIT MARKER MARGIN INK (KITS) IMPLANT
KIT TURNOVER KIT A (KITS) ×1 IMPLANT
MANIFOLD NEPTUNE II (INSTRUMENTS) ×1 IMPLANT
NDL HYPO 25X1 1.5 SAFETY (NEEDLE) ×1 IMPLANT
NEEDLE HYPO 25X1 1.5 SAFETY (NEEDLE) ×1 IMPLANT
NS IRRIG 1000ML POUR BTL (IV SOLUTION) ×1 IMPLANT
PACK MINOR (CUSTOM PROCEDURE TRAY) ×1 IMPLANT
PAD ARMBOARD POSITIONER FOAM (MISCELLANEOUS) ×1 IMPLANT
POSITIONER HEAD 8X9X4 ADT (SOFTGOODS) ×1 IMPLANT
SET BASIN LINEN APH (SET/KITS/TRAYS/PACK) ×1 IMPLANT
SUT MNCRL AB 4-0 PS2 18 (SUTURE) ×1 IMPLANT
SUT VIC AB 3-0 SH 27X BRD (SUTURE) IMPLANT
SYR CONTROL 10ML LL (SYRINGE) ×1 IMPLANT

## 2024-03-06 NOTE — Progress Notes (Signed)
[facility]  Spoke with the patient's mother in the consultation room.  I explained that she tolerated the procedure without difficulty.  She has dissolvable stitches under the skin with overlying skin glue.  This will flake off in 10 to 14 days.  I discharged her home with a prescription for narcotic pain medication that they should take as needed for pain.  I also want her taking scheduled Tylenol.  If they take the narcotic pain medication, they should take a stool softener as well.  The patient will follow-up with me in 2 weeks.  All questions were answered to her expressed satisfaction.  Graciella Freer, DO [facility] [address] [place], [place] [address] [phone] (office)

## 2024-03-06 NOTE — Anesthesia Postprocedure Evaluation (Signed)
 Anesthesia Post Note  Patient: Sherri Chang  Procedure(s) Performed: BREAST LUMPECTOMY TIMES TWO (Right: Breast)  Patient location during evaluation: Phase II Anesthesia Type: General Level of consciousness: awake Pain management: pain level controlled Vital Signs Assessment: post-procedure vital signs reviewed and stable Respiratory status: spontaneous breathing and respiratory function stable Cardiovascular status: blood pressure returned to baseline and stable Postop Assessment: no headache and no apparent nausea or vomiting Anesthetic complications: no Comments: Late entry   No notable events documented.   Last Vitals:  Vitals:   03/06/24 0930 03/06/24 0938  BP: (!) 125/92 (!) 127/97  Pulse: 86 82  Resp: 14 12  Temp:  36.5 C  SpO2: 100% 100%    Last Pain:  Vitals:   03/06/24 0938  TempSrc: Oral  PainSc: 3                  Coretha Dew

## 2024-03-06 NOTE — Transfer of Care (Signed)
 Immediate Anesthesia Transfer of Care Note  Patient: Sherri Chang  Procedure(s) Performed: BREAST LUMPECTOMY TIMES TWO (Right: Breast)  Patient Location: PACU  Anesthesia Type:General  Level of Consciousness: awake, alert , oriented, and patient cooperative  Airway & Oxygen Therapy: Patient Spontanous Breathing  Post-op Assessment: Report given to RN, Post -op Vital signs reviewed and stable, and Patient moving all extremities X 4  Post vital signs: Reviewed and stable  Last Vitals:  Vitals Value Taken Time  BP 127/97 03/06/24 0939  Temp 36.5 C 03/06/24 0938  Pulse 76 03/06/24 0941  Resp 12 03/06/24 0941  SpO2 100 % 03/06/24 0941  Vitals shown include unfiled device data.  Last Pain:  Vitals:   03/06/24 0938  TempSrc: Oral  PainSc: 3       Patients Stated Pain Goal: 7 (03/06/24 4782)  Complications: No notable events documented.

## 2024-03-06 NOTE — Discharge Instructions (Addendum)
 Ambulatory Surgery Discharge Instructions  General Anesthesia or Sedation Do not drive or operate heavy machinery for 24 hours.  Do not consume alcohol, tranquilizers, sleeping medications, or any non-prescribed medications for 24 hours. Do not make important decisions or sign any important papers in the next 24 hours. You should have someone with you tonight at home.  Activity  You are advised to go directly home from the hospital.  Restrict your activities and rest for a day.  Resume light activity tomorrow. No heavy lifting over 10 lbs or strenuous exercise.  Fluids and Diet Begin with clear liquids, bouillon, dry toast, soda crackers.  If not nauseated, you may go to a regular diet when you desire.  Greasy and spicy foods are not advised.  Medications  If you have not had a bowel movement in 24 hours, take 2 tablespoons over the counter Milk of mag.             You May resume your blood thinners tomorrow (Aspirin, coumadin, or other).  You are being discharged with prescriptions for Opioid/Narcotic Medications: There are some specific considerations for these medications that you should know. Opioid Meds have risks & benefits. Addiction to these meds is always a concern with prolonged use Take medication only as directed Do not drive while taking narcotic pain medication Do not crush tablets or capsules Do not use a different container than medication was dispensed in Lock the container of medication in a cool, dry place out of reach of children and pets. Opioid medication can cause addiction Do not share with anyone else (this is a felony) Do not store medications for future use. Dispose of them properly.     Disposal:  Find a [employer] household drug take back site near you.  If you can't get to a drug take back site, use the recipe below as a last resort to dispose of expired, unused or unwanted drugs. Disposal  (Do not dispose chemotherapy drugs this way, talk to your  prescribing doctor instead.) Step 1: Mix drugs (do not crush) with dirt, kitty litter, or used coffee grounds and add a small amount of water to dissolve any solid medications. Step 2: Seal drugs in plastic bag. Step 3: Place plastic bag in trash. Step 4: Take prescription container and scratch out personal information, then recycle or throw away.  Operative Site  You have a liquid bandage over your incisions, this will begin to flake off in about a week. Ok to English as a second language teacher. Keep wound clean and dry. No baths or swimming. No lifting more than 10 pounds.  Contact Information: If you have questions or concerns, please call our office, [phone], Monday- Thursday 8AM-5PM and Friday 8AM-12Noon.  If it is after hours or on the weekend, please call [facility] Main [facility], [phone], and ask to speak to the surgeon on call for Dr. Robyne Peers at [facility].   SPECIFIC COMPLICATIONS TO WATCH FOR: Inability to urinate Fever over 101? F by mouth Nausea and vomiting lasting longer than 24 hours. Pain not relieved by medication ordered Swelling around the operative site Increased redness, warmth, hardness, around operative area Numbness, tingling, or cold fingers or toes Blood -soaked dressing, (small amounts of oozing may be normal) Increasing and progressive drainage from surgical area or exam site

## 2024-03-06 NOTE — Anesthesia Procedure Notes (Signed)
 Procedure Name: LMA Insertion Date/Time: 03/06/2024 7:48 AM  Performed by: Beverely Buba, CRNAPre-anesthesia Checklist: Patient identified, Emergency Drugs available, Suction available, Patient being monitored and Timeout performed Patient Re-evaluated:Patient Re-evaluated prior to induction Oxygen Delivery Method: Circle system utilized Preoxygenation: Pre-oxygenation with 100% oxygen Induction Type: IV induction LMA: LMA inserted LMA Size: 3.0 Number of attempts: 1 Placement Confirmation: positive ETCO2, CO2 detector and breath sounds checked- equal and bilateral Tube secured with: Tape Dental Injury: Teeth and Oropharynx as per pre-operative assessment

## 2024-03-06 NOTE — Op Note (Signed)
 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  She has a known fibroadenoma at the 7 o'clock position on her right breast, but she developed a new area retroareolar that causes her pain.  She underwent breast imaging and core needle biopsy, which demonstrated fibroadenoma.  Given her pain, she would like both areas removed at this time.  She is agreeable to surgery.  All risks and benefits of performing this procedure were discussed with the patient including pain, infection, bleeding, damage to the surrounding structures, and need for more procedures or surgery. The patient voiced understanding of the procedure, all questions were sought and answered, and consent was obtained.  Findings: - Right breast mass at the retroareolar position, removed and oriented with the painting system -Right breast mass at the 7 o'clock position, removed and oriented with the painting system -Hemostasis noted at the completion of the case   Procedure: The patient was taken to the operating room and placed supine.  LMA anesthesia was induced. Intravenous antibiotics were administered per protocol.  The right breast and chest were prepared and draped in the usual sterile fashion. A time-out was completed verifying correct patient,  procedure, site, positioning, and implant(s) and/or special equipment prior to beginning this procedure.  Attention was then drawn to the right breast retroareolar mass first.  The mass was palpable at the superior aspect just behind the nipple.  A curvilinear incision was made over top of the palpable mass, and flaps were raised.  A ball of tissue was taken surrounding the palpable mass.  The specimen was oriented using our painting system. This specimen was then sent to pathology for evaluation.  Electrocautery was used to achieve hemostasis.  Attention was then shifted to the second right breast mass at the 7 o'clock position.  An incision was made over top of the palpable mass and flaps were raised.  A ball of tissue was taken surrounding the palpable mass.  The specimen was oriented using our painting system. This specimen was then sent to pathology for evaluation.  Electrocautery was used to achieve hemostasis.   Both incisions were localized with Marcaine .  The dermis was closed with 3-0 Vicryl in an interrupted fashion, and 4-0 Monocryl used to close the skin in a subcuticular fashion.  The incisions were dressed with Dermabond.  A compression bra was also placed   Within the axillary incision, the clavipectoral fascia was approximated.  The subcutaneous tissue was reapproximated with interrupted sutures in the dermis, followed by skin closure with a subcuticular monocryl suture.  A dressing was applied as was a compression bra.   Final inspection revealed acceptable hemostasis. All counts were correct at the end of the case. The patient was awakened from anesthesia without complication.  The patient went to the PACU in stable condition.   Lidia Reels, DO  [facility] [address]  [place], [place] [address] [phone] (office)

## 2024-03-06 NOTE — Anesthesia Preprocedure Evaluation (Signed)
 Anesthesia Evaluation  Patient identified by MRN, date of birth, ID band Patient awake    Reviewed: Allergy & Precautions, H&P , NPO status , Patient's Chart, lab work & pertinent test results, reviewed documented beta blocker date and time   Airway Mallampati: II  TM Distance: >3 FB Neck ROM: full    Dental no notable dental hx.    Pulmonary neg pulmonary ROS   Pulmonary exam normal breath sounds clear to auscultation       Cardiovascular Exercise Tolerance: Good hypertension, negative cardio ROS  Rhythm:regular Rate:Normal     Neuro/Psych  Headaches  negative psych ROS   GI/Hepatic negative GI ROS, Neg liver ROS,,,  Endo/Other  negative endocrine ROS    Renal/GU negative Renal ROS  negative genitourinary   Musculoskeletal   Abdominal   Peds  Hematology negative hematology ROS (+)   Anesthesia Other Findings   Reproductive/Obstetrics negative OB ROS                             Anesthesia Physical Anesthesia Plan  ASA: 2  Anesthesia Plan: General and General LMA   Post-op Pain Management:    Induction:   PONV Risk Score and Plan: Ondansetron  Airway Management Planned:   Additional Equipment:   Intra-op Plan:   Post-operative Plan:   Informed Consent: I have reviewed the patients History and Physical, chart, labs and discussed the procedure including the risks, benefits and alternatives for the proposed anesthesia with the patient or authorized representative who has indicated his/her understanding and acceptance.     Dental Advisory Given  Plan Discussed with: CRNA  Anesthesia Plan Comments:        Anesthesia Quick Evaluation

## 2024-03-06 NOTE — H&P (Signed)
 [facility] History and Physical   Sherri Chang is a 28 y.o. female.  HPI: 28 y.o. Female presents to clinic for follow-up after right breast ultrasound and ultrasound-guided biopsy.  The final results of her biopsy demonstrated fibroadenoma for the retroareolar mass.  Patient is here to discuss excision of both the retroareolar and the 7 o'clock position fibroadenomas.  She has had pain associated with the retroareolar mass, but otherwise has no new acute complaints.  She has no new medical history and has not had any other procedures than the biopsy.  She confirms a little bit of milky discharge from her right breast.   Past Medical History:  Diagnosis Date   Adenotonsillar hypertrophy 03/2017   Fibroadenoma 11/19/2017   Migraines     Past Surgical History:  Procedure Laterality Date   BREAST BIOPSY Right 01/07/2024   US  RT BREAST BX W LOC DEV 1ST LESION IMG BX SPEC US  GUIDE 01/07/2024 Sherri Infield, MD AP-ULTRASOUND   NO PAST SURGERIES      History reviewed. No pertinent family history.  Social History   Tobacco Use   Smoking status: Never   Smokeless tobacco: Never  Vaping Use   Vaping status: Never Used  Substance Use Topics   Alcohol use: No   Drug use: No    Medications: I have reviewed the patient's current medications. Allergies as of 03/06/2024       Reactions   Latex Itching   HANDS TURN RED       ROS:  Pertinent items are noted in HPI.  Blood pressure 122/89, temperature 98.2 F (36.8 C), resp. rate 20, height 5' (1.524 m), weight 45.5 kg, SpO2 100%. Physical Exam Physical Exam Vitals reviewed.  Constitutional:      Appearance: Normal appearance.  Chest:  Breasts:    Right: Mass, nipple discharge and tenderness present. No inverted nipple or skin change.     Left: No inverted nipple, mass, nipple discharge, skin change or tenderness.     Comments: Right breast with a retroareolar mass is palpable closer to the 12 o'clock  position just below the nipple, small amount of milky discharge noted at the nipple; small 1 cm palpable mass at the 7 o'clock position Lymphadenopathy:     Upper Body:     Right upper body: No axillary adenopathy.     Left upper body: No axillary adenopathy.  Neurological:     Mental Status: She is alert.   Results: Results for orders placed or performed during the hospital encounter of 03/04/24 (from the past 48 hours)  Pregnancy, urine POC     Status: None   Collection Time: 03/04/24 10:03 AM  Result Value Ref Range   Preg Test, Ur NEGATIVE NEGATIVE    Comment:        THE SENSITIVITY OF THIS METHODOLOGY IS >24 mIU/mL     Imaging:  Right breast ultrasound (12/26/2023): IMPRESSION: Palpable and tender 1.3 cm mass in the right breast at 12 o'clock retroareolar.   RECOMMENDATION: The option of short-term follow-up versus proceeding with ultrasound-guided core biopsy was discussed with the patient. The patient prefers biopsy at this time and it will be scheduled for the patient at her convenience.   I have discussed the findings and recommendations with the patient. If applicable, a reminder letter will be sent to the patient regarding the next appointment.   BI-RADS CATEGORY  4: Suspicious.   Right breast ultrasound-guided biopsy (01/07/2024): IMPRESSION: Ultrasound guided biopsy of the mass in  the right breast at 12 o'clock retroareolar. No apparent complications.   ADDENDUM: PATHOLOGY revealed: A. BREAST, RIGHT, 12 OC, BIOPSY: - FIBROADENOMA.   Pathology results are CONCORDANT with imaging findings, per Dr. Alger Chang.   Pathology results and recommendations were discussed with patient via telephone on 01/08/2024 by Sherri Pickup RN. Patient reported biopsy site doing well with no adverse symptoms, and only slight tenderness at the site. Post biopsy care instructions were reviewed, questions were answered and my direct phone number was provided. Patient  was instructed to call [facility] Mammography Department for any additional questions or concerns related to biopsy site.   RECOMMENDATION: Clinical follow-up with provider for persistent or subsequent concerns, and begin bilateral screening mammogram at age 76. Patient stated she discussed excision with provider/surgeon (Dr. Bynum Cassis Marshun Chang) and will follow-up with her to arrange surgical date.   Assessment:  28 y.o. yo Female who presents to discuss excision of 2 right breast fibroadenomas.   Plan:  -We again discussed her pathology noted from her right breast ultrasound guided biopsy.  I discussed that fibroadenoma is a benign finding and that we can excise the areas if they are causing her pain -The risk and benefits of right breast lumpectomy x 2 were discussed including but not limited to bleeding, infection, injury to surrounding structures, need for additional procedures.  After careful consideration, Sherri Chang has decided to proceed with surgery.  -Patient tentatively scheduled for surgery on 4/25 -Information provided to the patient regarding fibroadenomas and lumpectomies   All of the above recommendations were discussed with the patient, and all of patient's questions were answered to her expressed satisfaction.   Note: Portions of this report may have been transcribed using voice recognition software. Every effort has been made to ensure accuracy; however, inadvertent computerized transcription errors may still be present.   Sherri Reels, DO [facility] [address] [place], [place] [address] [phone] (office)

## 2024-03-07 ENCOUNTER — Encounter (HOSPITAL_COMMUNITY): Payer: Self-pay | Admitting: Surgery

## 2024-03-09 ENCOUNTER — Telehealth: Payer: Self-pay | Admitting: Family Medicine

## 2024-03-09 ENCOUNTER — Telehealth (INDEPENDENT_AMBULATORY_CARE_PROVIDER_SITE_OTHER): Admitting: Surgery

## 2024-03-09 DIAGNOSIS — D241 Benign neoplasm of right breast: Secondary | ICD-10-CM

## 2024-03-09 LAB — SURGICAL PATHOLOGY

## 2024-03-09 NOTE — Telephone Encounter (Signed)
 [facility]  Called to update the patient regarding her pathology results.  I explained that both areas were fibroadenomas, and there was no evidence of atypia or malignancy.  She is doing well and has no complaints.  She will see me on 5/8.  All questions answered to her expressed satisfaction.  Pathology: A. BREAST MASS, RIGHT RETRO AREOLAR, LUMPECTOMY:       Fibroadenoma (1.1 cm).       Completely excised.       Negative for atypia or malignancy.   B. BREAST MASS, RIGHT AT 7 O'CLOCK POSITION, LUMPECTOMY:       Fibroadenoma (0.85 cm).       Completely excised.       Negative for atypia or malignancy.   Sherri Reels, DO [facility] [name], [place] [address] [phone] (office)

## 2024-03-09 NOTE — Telephone Encounter (Signed)
 Completed FMLA paperwork and faxed to Marlana Silvan at [phone]. Confirmation received.  Out of work starting 03/06/2024 and may return to work unrestricted on 03/23/2024.

## 2024-03-19 ENCOUNTER — Encounter: Payer: Self-pay | Admitting: Surgery

## 2024-03-19 ENCOUNTER — Ambulatory Visit (INDEPENDENT_AMBULATORY_CARE_PROVIDER_SITE_OTHER): Admitting: Surgery

## 2024-03-19 VITALS — BP 118/84 | HR 73 | Temp 98.7°F | Resp 12 | Ht 60.0 in | Wt 108.0 lb

## 2024-03-19 DIAGNOSIS — Z09 Encounter for follow-up examination after completed treatment for conditions other than malignant neoplasm: Secondary | ICD-10-CM

## 2024-03-19 NOTE — Progress Notes (Signed)
 [facility] Note   HPI:  28 y.o. Female presents to clinic for post-op follow-up status post right breast lumpectomy x 2 on 4/25.  Patient has been doing well since the surgery.  She only has significant pain related to her bra.  She denies any fevers or chills.  Denies any issues at her incision sites.  Review of Systems:  All other review of systems: otherwise negative   Vital Signs:  BP 118/84   Pulse 73   Temp 98.7 F (37.1 C) (Oral)   Resp 12   Ht 5' (1.524 m)   Wt 108 lb (49 kg)   LMP  (LMP Unknown) Comment: 03/04/24 negative urine pregnancy  SpO2 98%   BMI 21.09 kg/m    Physical Exam:  Physical Exam Vitals reviewed.  Constitutional:      Appearance: Normal appearance.  Chest:     Comments: Right breast incision sites healing well with Dermabond in place, no surrounding erythema, induration, nontender to palpation Neurological:     Mental Status: She is alert.     Laboratory studies: None   Imaging:  None  Pathology: A. BREAST MASS, RIGHT RETRO AREOLAR, LUMPECTOMY:       Fibroadenoma (1.1 cm).       Completely excised.       Negative for atypia or malignancy.   B. BREAST MASS, RIGHT AT 7 O'CLOCK POSITION, LUMPECTOMY:       Fibroadenoma (0.85 cm).       Completely excised.       Negative for atypia or malignancy.   Assessment:  28 y.o. yo Female who presents for follow-up status post right breast lumpectomy x 2 on 4/25  Plan:  - Patient is doing well since the surgery.  Her pain is well-controlled and she has no issues at her incision sites - Advised that she can use antibiotic ointment at her incision sites prior to showering to help loosen the remaining skin glue.  The glue will eventually come off, but it can sometimes take a little bit longer in some patients - Follow up with me as needed  All of the above recommendations were discussed with the patient, and all of patient's questions were answered to her expressed  satisfaction.  Note: Portions of this report may have been transcribed using voice recognition software. Every effort has been made to ensure accuracy; however, inadvertent computerized transcription errors may still be present.   Lidia Reels, DO [facility] [address] [place], [place] [address] [phone] (office)

## 2024-07-24 ENCOUNTER — Other Ambulatory Visit: Payer: Self-pay | Admitting: Obstetrics & Gynecology

## 2024-11-16 ENCOUNTER — Telehealth: Payer: Self-pay | Admitting: *Deleted

## 2024-11-16 NOTE — Telephone Encounter (Signed)
 Surgical Date: 03/06/2024 Procedure: Breast Lumpectomy x2, right   Received call from patient [phone]~ telephone  Patient reports recent episodes of retroareolar pain. Reports that she has inversion of nipple on the right side. States that she also has some intermittent redness to breast.   Requested appointment with provider to evaluate.

## 2024-11-17 ENCOUNTER — Encounter: Payer: Self-pay | Admitting: Surgery

## 2024-11-17 ENCOUNTER — Ambulatory Visit (INDEPENDENT_AMBULATORY_CARE_PROVIDER_SITE_OTHER): Admitting: Surgery

## 2024-11-17 VITALS — BP 128/85 | HR 75 | Temp 98.4°F | Resp 12 | Ht 60.0 in | Wt 112.0 lb

## 2024-11-17 DIAGNOSIS — N644 Mastodynia: Secondary | ICD-10-CM

## 2024-11-17 NOTE — Patient Instructions (Signed)
What are the treatments for breast pain? °- Use less salt. °- Wear a supportive bra. °- Apply local heat to the painful area. °- Take over-the-counter pain relievers sparingly, as needed. °- Avoid caffeine. Well-designed studies have not shown that avoiding caffeine can treat breast pain. However, many women report significant improvement in their symptoms when they reduce their intake of tea, coffee, chocolate, and energy drinks. °- Try Vitamin E. Studies have not consistently shown benefits of vitamin E for treating breast pain, though some women find it helpful. Using vitamin E for a few weeks to see if it will help is unlikely to cause any harm. However, long-term use of vitamin E supplementation is not recommended for breast pain, as there are some studies suggesting this may not be safe. °- Try evening primrose oil. Similar to vitamin E, studies have not consistently shown evening primrose oil to be helpful in treating breast pain, though it does help some women. Evening primrose oil is found over-the-counter. Side effects might include nausea, diarrhea, and headaches. In the past, there was concern that certain patients might be at increased risk of seizures when taking this supplement, though this is now disputed. °- Try Omega-3 fatty acid. Though not proven to be effective in rigorous studies, some women find increased intake of fish oils/omega-3 supplements to be helpful. Natural dietary sources include: dark green leafy vegetables, ocean-raised ("wild") cold-water fish, flax, walnuts, and sesame. Omega-3 supplements are also available by prescription and over-the-counter. °- Give it time. Most commonly, pain goes away on its own after a few months, without the need for any treatment. ° °

## 2024-11-19 ENCOUNTER — Ambulatory Visit: Admitting: Surgery

## 2024-11-19 NOTE — Progress Notes (Signed)
 "    [facility] Note   HPI:  29 y.o. Female presents to clinic for who presents for worsening retroareolar right breast pain.  Patient is status post right breast lumpectomy x 2 on 03/06/2024.  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  She states that she has been having pain under her nipple for a while, but over the past couple of weeks it has gotten significantly worse to the point that she cannot wear a bra.  She does not believe that anything in particular is causing the pain and does not seem to be associated with her periods.  She denies any new past medical history or surgical history since she was last seen by me.  She denies any nipple discharge or pain in any other location in either breast.  Review of Systems:  All other review of systems: otherwise negative   Vital Signs:  BP 128/85   Pulse 75   Temp 98.4 F (36.9 C) (Oral)   Resp 12   Ht 5' (1.524 m)   Wt 112 lb (50.8 kg)   SpO2 98%   BMI 21.87 kg/m    Physical Exam:  Physical Exam Vitals reviewed.  Constitutional:      Appearance: Normal appearance.  HENT:     Head: Normocephalic and atraumatic.  Eyes:     Extraocular Movements: Extraocular movements intact.     Pupils: Pupils are equal, round, and reactive to light.  Cardiovascular:     Rate and Rhythm: Normal rate.  Pulmonary:     Effort: Pulmonary effort is normal.  Chest:     Comments: Right breast with palpable fullness below the circumareolar incision, likely scar tissue without definitive mass; no other palpable abnormalities or pain in the right breast; no abnormalities noted on left breast Abdominal:     General: There is no distension.     Palpations: Abdomen is soft.     Tenderness: There is no abdominal tenderness.  Musculoskeletal:        General: Normal range of motion.     Cervical back: Normal range of motion.  Lymphadenopathy:     Upper Body:     Right upper body: No axillary adenopathy.     Left upper  body: No axillary adenopathy.  Skin:    General: Skin is warm and dry.  Neurological:     General: No focal deficit present.     Mental Status: She is alert and oriented to person, place, and time.  Psychiatric:        Mood and Affect: Mood normal.        Behavior: Behavior normal.     Laboratory studies: None  Imaging:  None  Assessment:  29 y.o. yo Female who presents with right breast retroareolar pain.  She is status post right breast lumpectomy x 2 on 03/06/2024.  Plan:  - I discussed with the patient that the fullness we feel below the incision site is likely scar tissue, however we we will proceed with right breast ultrasound to further evaluate for any other abnormalities - Right breast ultrasound ordered - Information provided to the patient regarding breast pain and different over-the-counter treatment options - I will call the patient with the results of her ultrasound  All of the above recommendations were discussed with the patient, and all of patient's questions were answered to her expressed satisfaction.  Note: Portions of this report may have been transcribed using voice recognition software. Every effort has been made  to ensure accuracy; however, inadvertent computerized transcription errors may still be present.   Dorothyann Brittle, DO [facility] [address] [place], [place] [address] [phone] (office) "

## 2024-12-01 ENCOUNTER — Ambulatory Visit (HOSPITAL_COMMUNITY)
Admission: RE | Admit: 2024-12-01 | Discharge: 2024-12-01 | Disposition: A | Discharge status: Home / Self Care | Source: Ambulatory Visit | Attending: Surgery | Admitting: Surgery

## 2024-12-01 DIAGNOSIS — N644 Mastodynia: Secondary | ICD-10-CM | POA: Insufficient documentation

## 2024-12-03 ENCOUNTER — Telehealth (INDEPENDENT_AMBULATORY_CARE_PROVIDER_SITE_OTHER): Payer: Self-pay | Admitting: Surgery

## 2024-12-03 DIAGNOSIS — N644 Mastodynia: Secondary | ICD-10-CM

## 2024-12-03 NOTE — Telephone Encounter (Signed)
 [facility]  Called to update the patient regarding the results of her right breast ultrasound.  I explained that the ultrasound does not demonstrate any abnormal lesions or pathology in the location of her pain.  After further questioning, the patient believes that her pain may have been related to her menstruation, and the pain has improved slightly.  At the last visit, I did give the patient information on different conservative modalities to try to control her breast pain.  I encouraged her to try these to see if it helps with her pain.  Discussed that there is nothing currently to operate on, and the hardness she felt around her nipple was likely related to scar tissue.  Advised that this will continue to soften as she gets farther out from surgery.  All questions were answered to her expressed satisfaction.  Right breast ultrasound (12/01/2024): IMPRESSION: No sonographic abnormality identified at the site of patient's pain. No evidence of abscess or recurrent mass.   RECOMMENDATION: Any further workup of the patient's symptoms should be based on the clinical assessment.   Screening mammogram at age 20 unless there are persistent or intervening clinical concerns. (Code:SM-B-40A)   I have discussed the findings and recommendations with the patient. If applicable, a reminder letter will be sent to the patient regarding the next appointment.   BI-RADS CATEGORY  2: Benign.  Dorothyann Brittle, DO [facility] [address] [place], [place] [address] [phone] (office)
